# Patient Record
Sex: Female | Born: 1970 | Hispanic: Yes | Marital: Single | Smoking: Never smoker
Health system: Southern US, Community
[De-identification: ages and names within clinical notes are randomized; demographics above are authoritative.]

## PROBLEM LIST (undated history)

## (undated) DIAGNOSIS — I10 Essential (primary) hypertension: Secondary | ICD-10-CM

## (undated) DIAGNOSIS — J45909 Unspecified asthma, uncomplicated: Secondary | ICD-10-CM

---

## 2007-09-05 ENCOUNTER — Emergency Department: Admit: 2007-09-05 | Payer: Self-pay | Source: Emergency Department | Admitting: Emergency Medicine

## 2007-09-05 ENCOUNTER — Inpatient Hospital Stay (INDEPENDENT_AMBULATORY_CARE_PROVIDER_SITE_OTHER)
Admit: 2007-09-05 | Disposition: A | Discharge status: Home / Self Care | Payer: Self-pay | Source: Ambulatory Visit | Admitting: Obstetrics & Gynecology

## 2007-09-05 LAB — PRENATAL  WORKUP
AB Screen Gel: NEGATIVE
ABO Rh: O POS

## 2007-09-05 LAB — CBC AND DIFFERENTIAL
Basophils Absolute: 0 /mm3 (ref 0.0–0.2)
Basophils: 0 % (ref 0–2)
Eosinophils Absolute: 0.2 /mm3 (ref 0.0–0.7)
Eosinophils: 2 % (ref 0–5)
Granulocytes Absolute: 7.9 /mm3 (ref 1.8–8.1)
Hematocrit: 33.6 % — ABNORMAL LOW (ref 37.0–47.0)
Hgb: 10.2 G/DL — ABNORMAL LOW (ref 12.0–16.0)
Immature Granulocytes Absolute: 0
Immature Granulocytes: 0 %
Lymphocytes Absolute: 1.5 /mm3 (ref 0.5–4.4)
Lymphocytes: 14 % — ABNORMAL LOW (ref 15–41)
MCH: 18.7 PG — ABNORMAL LOW (ref 28.0–32.0)
MCHC: 30.4 G/DL — ABNORMAL LOW (ref 32.0–36.0)
MCV: 61.5 FL — ABNORMAL LOW (ref 80.0–100.0)
MPV: 10.3 FL (ref 9.4–12.3)
Monocytes Absolute: 0.6 /mm3 (ref 0.0–1.2)
Monocytes: 6 % (ref 0–11)
Neutrophils %: 78 % — ABNORMAL HIGH (ref 52–75)
Platelets: 249 /mm3 (ref 140–400)
RBC: 5.46 /mm3 — ABNORMAL HIGH (ref 4.20–5.40)
RDW: 16.5 % — ABNORMAL HIGH (ref 11.5–15.0)
WBC: 10.12 /mm3 (ref 3.50–10.80)

## 2007-09-05 LAB — HEPATITIS B SURFACE ANTIGEN W/ REFLEX TO CONFIRMATION: Hepatitis B Surface Antigen: NEGATIVE

## 2007-09-05 LAB — HIV RAPID: HIV Rapid: NONREACTIVE

## 2007-09-05 LAB — GLUCOSE CHALLENGE: Glucose Challenge: 80 mg/dL

## 2007-09-05 LAB — CELL MORPHOLOGY: RBC Morphology: ABNORMAL

## 2007-09-05 LAB — SICKLE CELL SCREEN: Sickle Screen Test: NEGATIVE

## 2007-09-06 LAB — RPR: RPR: NONREACTIVE

## 2007-10-03 LAB — FETAL FIBRONECTIN: Fetal Fibronectin: NEGATIVE

## 2007-10-03 LAB — URINALYSIS
Bilirubin, UA: NEGATIVE
Blood, UA: NEGATIVE
Glucose, UA: NEGATIVE
Ketones UA: NEGATIVE
Nitrite, UA: NEGATIVE
Protein, UR: NEGATIVE
Specific Gravity UA POCT: 1.011 (ref 1.001–1.035)
Urine pH: 6.5 (ref 5.0–8.0)
Urobilinogen, UA: 0.2 EU/dL (ref 0.2–1.0)

## 2007-10-04 LAB — RUBELLA ANTIBODY, IGG

## 2007-11-08 LAB — URINALYSIS
Bilirubin, UA: NEGATIVE
Glucose, UA: NEGATIVE
Ketones UA: NEGATIVE
Leukocyte Esterase, UA: NEGATIVE
Nitrite, UA: NEGATIVE
Protein, UR: NEGATIVE
Specific Gravity UA POCT: 1.028 (ref 1.001–1.035)
Urine pH: 7 (ref 5.0–8.0)
Urobilinogen, UA: 0.2 EU/dL (ref 0.2–1.0)

## 2007-11-14 ENCOUNTER — Observation Stay (HOSPITAL_BASED_OUTPATIENT_CLINIC_OR_DEPARTMENT_OTHER)
Admission: RE | Admit: 2007-11-14 | Disposition: A | Discharge status: Home / Self Care | Payer: Self-pay | Source: Ambulatory Visit

## 2007-11-15 ENCOUNTER — Observation Stay (HOSPITAL_BASED_OUTPATIENT_CLINIC_OR_DEPARTMENT_OTHER)
Admission: RE | Admit: 2007-11-15 | Disposition: A | Discharge status: Home / Self Care | Payer: Self-pay | Source: Ambulatory Visit

## 2007-11-19 ENCOUNTER — Inpatient Hospital Stay (HOSPITAL_BASED_OUTPATIENT_CLINIC_OR_DEPARTMENT_OTHER)
Admission: AD | Admit: 2007-11-19 | Disposition: A | Discharge status: Home / Self Care | Payer: Self-pay | Source: Ambulatory Visit

## 2007-11-19 LAB — CBC
Hematocrit: 33.3 % — ABNORMAL LOW (ref 37.0–47.0)
Hgb: 10.6 G/DL — ABNORMAL LOW (ref 12.0–16.0)
MCH: 19.1 PG — ABNORMAL LOW (ref 28.0–32.0)
MCHC: 31.8 G/DL — ABNORMAL LOW (ref 32.0–36.0)
MCV: 60.1 FL — ABNORMAL LOW (ref 80.0–100.0)
Platelets: 238 /mm3 (ref 140–400)
RBC: 5.54 /mm3 — ABNORMAL HIGH (ref 4.20–5.40)
RDW: 15 % (ref 11.5–15.0)
WBC: 12.43 /mm3 — ABNORMAL HIGH (ref 3.50–10.80)

## 2009-02-23 ENCOUNTER — Inpatient Hospital Stay
Admission: EM | Admit: 2009-02-23 | Disposition: A | Discharge status: Home / Self Care | Payer: Self-pay | Source: Emergency Department | Admitting: Obstetrics & Gynecology

## 2009-02-23 LAB — BLOOD BANK TEST INFORMATION

## 2009-02-26 LAB — COMPREHENSIVE METABOLIC PANEL
ALT: 26 U/L (ref 7–56)
AST (SGOT): 21 U/L (ref 5–40)
Albumin, Synovial: 3.1 g/dL — ABNORMAL LOW (ref 3.9–5.0)
Alkaline Phosphatase: 60 U/L (ref 38–126)
BUN / Creatinine Ratio: 16 (ref 8–20)
BUN: 12 mg/dL (ref 6–20)
Bilirubin, Total: 0.2 mg/dL (ref 0.2–1.3)
CO2: 22 mmol/L (ref 21.0–31.0)
Calcium: 9.1 mg/dL (ref 8.4–10.2)
Chloride: 102 mmol/L (ref 101–111)
Creatinine: 0.75 mg/dL (ref 0.52–1.04)
EGFR: >60 mL/min/{1.73_m2}
EGFR: >60 mL/min/{1.73_m2}
Glucose: 117 mg/dL — ABNORMAL HIGH (ref 70–100)
Potassium: 3.4 mmol/L — ABNORMAL LOW (ref 3.6–5.0)
Protein, Total: 5.9 g/dL — ABNORMAL LOW (ref 6.3–8.2)
Sodium: 136 mmol/L (ref 135–145)

## 2009-02-26 LAB — HEMOGLOBIN AND HEMATOCRIT, BLOOD
Hematocrit: 27.6 % (ref 27.0–49.5)
Hemoglobin: 8.7 g/dL — ABNORMAL LOW (ref 11.7–15.5)

## 2009-02-26 LAB — CROSSMATCH PRBCS, 2 UNITS
01 -   Blood Type: O POS
01 -   Status Info: TRANSFUSED
02 -   Blood Type: O POS
02 -   Status Info: TRANSFUSED

## 2009-02-26 LAB — CBC AND DIFFERENTIAL
BASOPHILS %: 0.2 % (ref 0.0–2.0)
BASOPHILS %: 0.3 % (ref 0.0–2.0)
Baso(Absolute): 0.03 10*3/uL (ref 0.00–0.20)
Baso(Absolute): 0.02 10*3/uL (ref 0.00–0.20)
Eosinophils %: 0.3 % (ref 0.0–6.0)
Eosinophils %: 1.3 % (ref 0.0–6.0)
Eosinophils Absolute: 0.05 10*3/uL — ABNORMAL LOW (ref 0.10–0.30)
Eosinophils Absolute: 0.09 10*3/uL — ABNORMAL LOW (ref 0.10–0.30)
Hematocrit: 32.2 % (ref 27.0–49.5)
Hematocrit: 27.5 % (ref 27.0–49.5)
Hemoglobin: 10.2 g/dL — ABNORMAL LOW (ref 11.7–15.5)
Hemoglobin: 8.8 g/dL — ABNORMAL LOW (ref 11.7–15.5)
Immature Granulocytes #: 0.04 10*3/uL (ref 0.00–0.05)
Immature Granulocytes #: 0.02 10*3/uL (ref 0.00–0.05)
Immature Granulocytes %: 0.3 % — ABNORMAL HIGH (ref 0.0–0.0)
Immature Granulocytes %: 0.3 % — ABNORMAL HIGH (ref 0.0–0.0)
Lymphocytes Absolute: 1.72 10*3/uL (ref 1.00–4.80)
Lymphocytes Absolute: 1.99 10*3/uL (ref 1.00–4.80)
Lymphocytes Relative: 11.9 % — ABNORMAL LOW (ref 25.0–55.0)
Lymphocytes Relative: 29.4 % (ref 25.0–55.0)
MCH: 18.6 pg — ABNORMAL LOW (ref 27.0–34.0)
MCH: 20.5 pg — ABNORMAL LOW (ref 27.0–34.0)
MCHC: 31.7 g/dL — ABNORMAL LOW (ref 32.0–36.0)
MCHC: 32 g/dL (ref 32.0–36.0)
MCV: 58.7 fL — ABNORMAL LOW (ref 80–100)
MCV: 64.1 fL — ABNORMAL LOW (ref 80–100)
Monocytes Absolute: 0.58 10*3/uL (ref 0.10–1.20)
Monocytes Absolute: 0.34 10*3/uL (ref 0.10–1.20)
Monocytes Relative %: 4 % (ref 1.0–8.0)
Monocytes Relative %: 5 % (ref 1.0–8.0)
Neutrophils Absolute: 12.07 10*3/uL — ABNORMAL HIGH (ref 1.80–7.70)
Neutrophils Absolute: 4.33 10*3/uL (ref 1.80–7.70)
Neutrophils Relative %: 83.6 % — ABNORMAL HIGH (ref 49.0–69.0)
Neutrophils Relative %: 64 % (ref 49.0–69.0)
Nucleated RBC %: 0 /100WBC (ref 0.0–0.0)
Nucleated RBC %: 0 /100WBC (ref 0.0–0.0)
Nucleted RBC #: 0 10*3/uL (ref 0.00–0.00)
Nucleted RBC #: 0 10*3/uL (ref 0.00–0.00)
Platelets: 244 10*3/uL (ref 150–400)
Platelets: 166 10*3/uL (ref 150–400)
RBC: 5.49 M/uL — ABNORMAL HIGH (ref 3.80–5.40)
RBC: 4.29 M/uL (ref 3.80–5.40)
RDW: 16 % — ABNORMAL HIGH (ref 11.0–14.0)
RDW: 22.3 % — ABNORMAL HIGH (ref 11.0–14.0)
WBC: 14.45 10*3/uL — ABNORMAL HIGH (ref 4.80–10.80)
WBC: 6.77 10*3/uL (ref 4.80–10.80)

## 2009-02-26 LAB — MORPH REVIEW
Cell Morphology:: ABNORMAL
Cell Morphology:: ABNORMAL
Platelet Evaluation: NORMAL
Platelet Evaluation: NORMAL

## 2009-02-26 LAB — ANTIBODY SCREEN: AB Screen Gel: NEGATIVE

## 2009-02-26 LAB — PT (PROTHROMBIN TIME)
PT INR: 1.1
PT: 12.4 s (ref 10.6–12.8)

## 2009-02-26 LAB — PTT (PARTIAL THROMBOPLASTIN TIME)
PTT Ratio: 0.8 (ref 0.8–1.2)
PTT: 22.6 s (ref 21.6–34.0)

## 2009-02-26 LAB — ABO/RH: ABO Rh: O POS

## 2009-02-26 LAB — PATH REVIEW OF BLOOD SMEAR

## 2009-02-26 LAB — HCG, SERUM, QUALITATIVE: Hcg Qualitative: POSITIVE

## 2009-02-26 LAB — HCG, QUANTITATIVE, PREGNANCY: hCG, Quant.: 2280.3 m[IU]/mL — ABNORMAL HIGH (ref 0.0–6.1)

## 2011-04-26 NOTE — Op Note (Signed)
Jasmine Moran, Jasmine Moran                                                    MRN:          [id number]                                                          Account:      [id number]                                                     Document ID:  [id number]                                               Procedure Date: 02/24/2009                                                                                    MRN: [id number]  Admit Date: 02/23/2009     Patient Location: DISCHARGED 02/24/2009  Patient Type: I     SURGEON: ANGELA C BESS MD  ASSISTANT:          BRIEF PROCEDURE NOTE:    The patient is a 40 year old female seen and evaluated in the emergency  room for an incomplete abortion.  The patient underwent a manual vacuum  aspiration of products of conception.     BRIEF DESCRIPTION OF PROCEDURE:  After the patient was assessed and consent was obtained, a speculum was  placed.  The cervix was visibly dilated and there was noted to be clot and  debris at the cervical os.  A manual vacuum aspirator curette was then  placed through the cervix and advanced into the uterine cavity.  While  applying suction to the manual aspirator, there was a large return of  products of conception.  Several passes of the manual vacuum aspirator were  performed until the uterine contents were felt to be emptied.  The  patient's bleeding decreased significantly.     ESTIMATED BLOOD LOSS:  Approximately 500 mL during this procedure.  The patient had no anesthesia.   The patient tolerated the procedure well and there were no complications.              D:  04/09/2009 14:23 PM by Esmeralda Arthur, MD ([id number])  T:  04/12/2009 08:43 AM by Gwenyth Bender        cc:  Page 1 of 1  Authenticated by Esmeralda Arthur, MD On 04/13/2009 09:05:50 PM

## 2017-09-19 ENCOUNTER — Ambulatory Visit
Admission: RE | Admit: 2017-09-19 | Discharge: 2017-09-19 | Disposition: A | Discharge status: Home / Self Care | Payer: PRIVATE HEALTH INSURANCE | Source: Ambulatory Visit | Attending: Critical Care Medicine | Admitting: Critical Care Medicine

## 2017-09-19 DIAGNOSIS — D509 Iron deficiency anemia, unspecified: Secondary | ICD-10-CM

## 2017-09-19 DIAGNOSIS — J45909 Unspecified asthma, uncomplicated: Secondary | ICD-10-CM | POA: Insufficient documentation

## 2017-09-19 LAB — CBC AND DIFFERENTIAL
Absolute NRBC: 0 10*3/uL
Basophils Absolute Automated: 0.05 10*3/uL (ref 0.00–0.20)
Basophils Automated: 0.7 %
Eosinophils Absolute Automated: 0.23 10*3/uL (ref 0.00–0.70)
Eosinophils Automated: 3.1 %
Hematocrit: 43 % (ref 37.0–47.0)
Hgb: 12.7 g/dL (ref 12.0–16.0)
Immature Granulocytes Absolute: 0.03 10*3/uL
Immature Granulocytes: 0.4 %
Lymphocytes Absolute Automated: 1.97 10*3/uL (ref 0.50–4.40)
Lymphocytes Automated: 26.5 %
MCH: 18.3 pg — ABNORMAL LOW (ref 28.0–32.0)
MCHC: 29.5 g/dL — ABNORMAL LOW (ref 32.0–36.0)
MCV: 62 fL — ABNORMAL LOW (ref 80.0–100.0)
Monocytes Absolute Automated: 0.43 10*3/uL (ref 0.00–1.20)
Monocytes: 5.8 %
Neutrophils Absolute: 4.71 10*3/uL (ref 1.80–8.10)
Neutrophils: 63.5 %
Nucleated RBC: 0 /100 WBC (ref 0.0–1.0)
Platelets: 228 10*3/uL (ref 140–400)
RBC: 6.93 10*6/uL — ABNORMAL HIGH (ref 4.20–5.40)
RDW: 19 % — ABNORMAL HIGH (ref 12–15)
WBC: 7.42 10*3/uL (ref 3.50–10.80)

## 2017-09-19 LAB — CELL MORPHOLOGY
Cell Morphology: ABNORMAL — AB
Platelet Estimate: NORMAL

## 2017-09-21 LAB — CBC PATHOLOGIST REVIEW

## 2017-09-23 LAB — IGE: Immunoglobulin E: 94 (ref <=114)

## 2017-09-24 LAB — RESPIRATORY ALLERGY PROFILE, REGION II

## 2021-08-17 ENCOUNTER — Emergency Department: Payer: Self-pay

## 2021-08-17 ENCOUNTER — Inpatient Hospital Stay
Admission: EM | Admit: 2021-08-17 | Discharge: 2021-08-18 | DRG: 193 | Disposition: A | Discharge status: Home / Self Care | Payer: Self-pay | Attending: Student in an Organized Health Care Education/Training Program | Admitting: Student in an Organized Health Care Education/Training Program

## 2021-08-17 DIAGNOSIS — I10 Essential (primary) hypertension: Secondary | ICD-10-CM | POA: Diagnosis present

## 2021-08-17 DIAGNOSIS — J9601 Acute respiratory failure with hypoxia: Secondary | ICD-10-CM | POA: Diagnosis present

## 2021-08-17 DIAGNOSIS — R7989 Other specified abnormal findings of blood chemistry: Secondary | ICD-10-CM | POA: Diagnosis present

## 2021-08-17 DIAGNOSIS — Z20822 Contact with and (suspected) exposure to covid-19: Secondary | ICD-10-CM | POA: Diagnosis present

## 2021-08-17 DIAGNOSIS — J189 Pneumonia, unspecified organism: Principal | ICD-10-CM | POA: Diagnosis present

## 2021-08-17 DIAGNOSIS — J168 Pneumonia due to other specified infectious organisms: Secondary | ICD-10-CM

## 2021-08-17 DIAGNOSIS — R55 Syncope and collapse: Secondary | ICD-10-CM

## 2021-08-17 DIAGNOSIS — Z79899 Other long term (current) drug therapy: Secondary | ICD-10-CM

## 2021-08-17 DIAGNOSIS — R3915 Urgency of urination: Secondary | ICD-10-CM

## 2021-08-17 DIAGNOSIS — J45901 Unspecified asthma with (acute) exacerbation: Secondary | ICD-10-CM | POA: Diagnosis present

## 2021-08-17 DIAGNOSIS — R4182 Altered mental status, unspecified: Secondary | ICD-10-CM

## 2021-08-17 HISTORY — DX: Essential (primary) hypertension: I10

## 2021-08-17 HISTORY — DX: Unspecified asthma, uncomplicated: J45.909

## 2021-08-17 LAB — VH XPERT XPRESS © COV-2/FLU/RSV PLUS
Does patient reside in a congregate care setting?: NEGATIVE
Influenza A RNA: NEGATIVE
Influenza B RNA: NEGATIVE
Is patient employed in a healthcare setting?: NEGATIVE
RSV RNA: NEGATIVE
SARS-CoV-2 RNA: NEGATIVE

## 2021-08-17 LAB — I-STAT LACTIC ACID
Lactic Acid I-Stat: 4.9 mMol/L (ref 0.5–1.9)
Lactic Acid I-Stat: 6.1 mMol/L (ref 0.5–1.9)
Room Number I-Stat: 5
Room Number I-Stat: 5

## 2021-08-17 LAB — VH STREP PNEUMONIAE URINE ANTIGEN: Urine, Strep pneumoniae Antigen: NOT DETECTED

## 2021-08-17 LAB — COMPREHENSIVE METABOLIC PANEL
ALT: 42 U/L (ref 0–55)
AST (SGOT): 25 U/L (ref 10–42)
Albumin/Globulin Ratio: 1.12 Ratio (ref 0.80–2.00)
Albumin: 3.8 gm/dL (ref 3.5–5.0)
Alkaline Phosphatase: 92 U/L (ref 40–145)
Anion Gap: 14.4 mMol/L (ref 7.0–18.0)
BUN / Creatinine Ratio: 13.4 Ratio (ref 10.0–30.0)
BUN: 11 mg/dL (ref 7–22)
Bilirubin, Total: 0.6 mg/dL (ref 0.1–1.2)
CO2: 23 mMol/L (ref 20–30)
Calcium: 9.2 mg/dL (ref 8.5–10.5)
Chloride: 108 mMol/L (ref 98–110)
Creatinine: 0.82 mg/dL (ref 0.60–1.20)
EGFR: 87 mL/min/{1.73_m2} (ref 60–150)
Globulin: 3.4 gm/dL (ref 2.0–4.0)
Glucose: 118 mg/dL — ABNORMAL HIGH (ref 71–99)
Osmolality Calculated: 284 mOsm/kg (ref 275–300)
Potassium: 3.4 mMol/L — ABNORMAL LOW (ref 3.5–5.3)
Protein, Total: 7.2 gm/dL (ref 6.0–8.3)
Sodium: 142 mMol/L (ref 136–147)

## 2021-08-17 LAB — CBC AND DIFFERENTIAL
Basophils %: 1.2 % (ref 0.0–3.0)
Basophils Absolute: 0.1 10*3/uL (ref 0.0–0.3)
Eosinophils %: 3.4 % (ref 0.0–7.0)
Eosinophils Absolute: 0.3 10*3/uL (ref 0.0–0.8)
Hematocrit: 42.4 % (ref 36.0–48.0)
Hemoglobin: 13.2 gm/dL (ref 12.0–16.0)
Lymphocytes Absolute: 3.4 10*3/uL (ref 0.6–5.1)
Lymphocytes: 44 % (ref 15.0–46.0)
MCH: 19 pg — ABNORMAL LOW (ref 28–35)
MCHC: 31 gm/dL — ABNORMAL LOW (ref 32–36)
MCV: 61 fL — ABNORMAL LOW (ref 80–100)
MPV: 5.9 fL — ABNORMAL LOW (ref 6.0–10.0)
Monocytes Absolute: 0.6 10*3/uL (ref 0.1–1.7)
Monocytes: 8.1 % (ref 3.0–15.0)
Neutrophils %: 43.3 % (ref 42.0–78.0)
Neutrophils Absolute: 3.4 10*3/uL (ref 1.7–8.6)
PLT CT: 192 10*3/uL (ref 130–440)
RBC: 6.93 10*6/uL — ABNORMAL HIGH (ref 3.80–5.00)
RDW: 14.4 % — ABNORMAL HIGH (ref 11.0–14.0)
WBC: 7.8 10*3/uL (ref 4.0–11.0)

## 2021-08-17 LAB — B-TYPE NATRIURETIC PEPTIDE: B-Natriuretic Peptide: 17.9 pg/mL (ref 0.0–100.0)

## 2021-08-17 LAB — VH URINALYSIS WITH MICROSCOPIC AND CULTURE IF INDICATED
Bilirubin, UA: NEGATIVE mg/dL
Glucose, UA: NEGATIVE mg/dL
Ketones UA: NEGATIVE
Leukocyte Esterase, UA: NEGATIVE Leu/uL
Nitrite, UA: NEGATIVE
Protein, UR: 30 mg/dL — AB
RBC, UA: 2 /hpf (ref 0–5)
Squam Epithel, UA: <1 /hpf (ref 0–2)
Urine Specific Gravity: 1.01 (ref 1.005–1.030)
Urobilinogen, UA: 0.2 mg/dL
WBC, UA: 1 /hpf (ref 0–4)
pH, Urine: 6.5 pH (ref 5.0–8.0)

## 2021-08-17 LAB — VH LEGIONELLA ANTIGEN URINE: Urine Legionella Antigen Rapid: NOT DETECTED

## 2021-08-17 LAB — VH CULTURE AND SMEAR, RESPIRATORY

## 2021-08-17 LAB — TROPONIN I: Troponin I: <0.01 ng/mL (ref 0.00–0.02)

## 2021-08-17 LAB — MAGNESIUM: Magnesium: 1.8 mg/dL (ref 1.6–2.6)

## 2021-08-17 LAB — VH I-STAT LACTIC ACID NOTIFICATION

## 2021-08-17 LAB — LACTIC ACID, PLASMA
Lactic Acid: 8.5 mMol/L (ref 0.5–1.9)
Lactic Acid: 6.6 mMol/L (ref 0.5–1.9)
Lactic Acid: 5 mMol/L (ref 0.5–1.9)
Lactic Acid: 1.5 mMol/L (ref 0.5–1.9)

## 2021-08-17 LAB — D-DIMER, QUANTITATIVE: D-Dimer: 0.25 mg/L FEU (ref 0.19–0.52)

## 2021-08-17 LAB — PROCALCITONIN: Procalcitonin: 0.03 ng/mL (ref 0.00–0.24)

## 2021-08-17 MED ORDER — METHYLPREDNISOLONE SODIUM SUCC 40 MG IJ SOLR
INTRAMUSCULAR | Status: AC
Start: 2021-08-17 — End: ?
  Filled 2021-08-17: qty 1

## 2021-08-17 MED ORDER — ALBUTEROL-IPRATROPIUM 2.5-0.5 (3) MG/3ML IN SOLN
3.0000 mL | Freq: Once | RESPIRATORY_TRACT | Status: AC
Start: 2021-08-17 — End: 2021-08-17
  Administered 2021-08-17: 01:00:00 3 mL via RESPIRATORY_TRACT

## 2021-08-17 MED ORDER — VH MAGNESIUM SULFATE 2 G IN 50 ML IV PREMIX
INTRAVENOUS | Status: AC
Start: 2021-08-17 — End: ?
  Filled 2021-08-17: qty 50

## 2021-08-17 MED ORDER — ALBUTEROL-IPRATROPIUM 2.5-0.5 (3) MG/3ML IN SOLN
RESPIRATORY_TRACT | Status: AC
Start: 2021-08-17 — End: ?
  Filled 2021-08-17: qty 3

## 2021-08-17 MED ORDER — SODIUM CHLORIDE (PF) 0.9 % IJ SOLN
1.0000 g | Freq: Once | INTRAMUSCULAR | Status: AC
Start: 2021-08-17 — End: 2021-08-17
  Administered 2021-08-17: 03:00:00 1 g via INTRAVENOUS

## 2021-08-17 MED ORDER — AMLODIPINE BESYLATE 5 MG PO TABS
ORAL_TABLET | ORAL | Status: AC
Start: 2021-08-17 — End: ?
  Filled 2021-08-17: qty 1

## 2021-08-17 MED ORDER — VH LACTATED RINGERS IV BOLUS
1000.0000 mL | Freq: Once | INTRAVENOUS | Status: AC
Start: 2021-08-17 — End: 2021-08-17
  Administered 2021-08-17: 17:00:00 1000 mL via INTRAVENOUS

## 2021-08-17 MED ORDER — SODIUM CHLORIDE (PF) 0.9 % IJ SOLN
3.0000 mL | Freq: Two times a day (BID) | INTRAMUSCULAR | Status: DC
Start: 2021-08-17 — End: 2021-08-18
  Administered 2021-08-17 – 2021-08-18 (×3): 3 mL via INTRAVENOUS

## 2021-08-17 MED ORDER — VH SODIUM CHLORIDE 0.9 % IV BOLUS
1000.0000 mL | Freq: Once | INTRAVENOUS | Status: AC
Start: 2021-08-17 — End: 2021-08-17
  Administered 2021-08-17: 05:00:00 1000 mL via INTRAVENOUS

## 2021-08-17 MED ORDER — SODIUM CHLORIDE (PF) 0.9 % IJ SOLN
0.4000 mg | INTRAMUSCULAR | Status: DC | PRN
Start: 2021-08-17 — End: 2021-08-18

## 2021-08-17 MED ORDER — AMLODIPINE BESYLATE 5 MG PO TABS
5.0000 mg | ORAL_TABLET | Freq: Every day | ORAL | Status: DC
Start: 2021-08-17 — End: 2021-08-18
  Administered 2021-08-17 – 2021-08-18 (×2): 5 mg via ORAL
  Filled 2021-08-17: qty 1

## 2021-08-17 MED ORDER — ONDANSETRON HCL 4 MG/2ML IJ SOLN
4.0000 mg | Freq: Three times a day (TID) | INTRAMUSCULAR | Status: DC | PRN
Start: 2021-08-17 — End: 2021-08-18

## 2021-08-17 MED ORDER — ONDANSETRON 4 MG PO TBDP
4.0000 mg | ORAL_TABLET | Freq: Three times a day (TID) | ORAL | Status: DC | PRN
Start: 2021-08-17 — End: 2021-08-18

## 2021-08-17 MED ORDER — METHYLPREDNISOLONE SODIUM SUCC 40 MG IJ SOLR
40.0000 mg | Freq: Two times a day (BID) | INTRAMUSCULAR | Status: DC
Start: 2021-08-17 — End: 2021-08-18
  Administered 2021-08-17 – 2021-08-18 (×3): 40 mg via INTRAVENOUS
  Filled 2021-08-17 (×2): qty 1

## 2021-08-17 MED ORDER — AZITHROMYCIN 250 MG PO TABS
500.0000 mg | ORAL_TABLET | ORAL | Status: DC
Start: 2021-08-18 — End: 2021-08-18
  Administered 2021-08-18: 06:00:00 500 mg via ORAL
  Filled 2021-08-17: qty 2

## 2021-08-17 MED ORDER — ALBUTEROL-IPRATROPIUM 2.5-0.5 (3) MG/3ML IN SOLN
3.0000 mL | Freq: Once | RESPIRATORY_TRACT | Status: AC
Start: 2021-08-17 — End: 2021-08-17
  Administered 2021-08-17: 02:00:00 3 mL via RESPIRATORY_TRACT

## 2021-08-17 MED ORDER — ALBUTEROL-IPRATROPIUM 2.5-0.5 (3) MG/3ML IN SOLN
3.0000 mL | Freq: Four times a day (QID) | RESPIRATORY_TRACT | Status: DC
Start: 2021-08-17 — End: 2021-08-17
  Administered 2021-08-17 (×2): 3 mL via RESPIRATORY_TRACT

## 2021-08-17 MED ORDER — ENOXAPARIN SODIUM 40 MG/0.4ML IJ SOSY
PREFILLED_SYRINGE | INTRAMUSCULAR | Status: AC
Start: 2021-08-17 — End: ?
  Filled 2021-08-17: qty 0.4

## 2021-08-17 MED ORDER — METHYLPREDNISOLONE SODIUM SUCC 125 MG IJ SOLR
INTRAMUSCULAR | Status: AC
Start: 2021-08-17 — End: ?
  Filled 2021-08-17: qty 2

## 2021-08-17 MED ORDER — ENOXAPARIN SODIUM 40 MG/0.4ML IJ SOSY
40.0000 mg | PREFILLED_SYRINGE | INTRAMUSCULAR | Status: DC
Start: 2021-08-17 — End: 2021-08-18
  Administered 2021-08-17 – 2021-08-18 (×2): 40 mg via SUBCUTANEOUS
  Filled 2021-08-17: qty 0.4

## 2021-08-17 MED ORDER — ALBUTEROL-IPRATROPIUM 2.5-0.5 (3) MG/3ML IN SOLN
3.0000 mL | RESPIRATORY_TRACT | Status: DC
Start: 2021-08-17 — End: 2021-08-18
  Administered 2021-08-17 – 2021-08-18 (×7): 3 mL via RESPIRATORY_TRACT
  Filled 2021-08-17 (×5): qty 3

## 2021-08-17 MED ORDER — CEFTRIAXONE SODIUM 1 G IJ SOLR
INTRAMUSCULAR | Status: AC
Start: 2021-08-17 — End: ?
  Filled 2021-08-17: qty 1000

## 2021-08-17 MED ORDER — VH LACTATED RINGERS IV BOLUS
500.0000 mL | Freq: Once | INTRAVENOUS | Status: AC
Start: 2021-08-17 — End: 2021-08-17
  Administered 2021-08-17: 11:00:00 500 mL via INTRAVENOUS

## 2021-08-17 MED ORDER — VH MAGNESIUM SULFATE 2 G IN 50 ML IV PREMIX
2.0000 g | Freq: Once | INTRAVENOUS | Status: AC
Start: 2021-08-17 — End: 2021-08-17
  Administered 2021-08-17: 01:00:00 2 g via INTRAVENOUS

## 2021-08-17 MED ORDER — SODIUM CHLORIDE 0.9 % IV SOLN
500.0000 mg | Freq: Once | INTRAVENOUS | Status: AC
Start: 2021-08-17 — End: 2021-08-17
  Administered 2021-08-17: 03:00:00 500 mg via INTRAVENOUS
  Filled 2021-08-17: qty 500

## 2021-08-17 MED ORDER — SODIUM CHLORIDE (PF) 0.9 % IJ SOLN
1.0000 g | INTRAMUSCULAR | Status: DC
Start: 2021-08-17 — End: 2021-08-18
  Administered 2021-08-17: 23:00:00 1 g via INTRAVENOUS
  Filled 2021-08-17: qty 1000

## 2021-08-17 MED ORDER — ALBUTEROL SULFATE (2.5 MG/3ML) 0.083% IN NEBU
INHALATION_SOLUTION | RESPIRATORY_TRACT | Status: AC
Start: 2021-08-17 — End: ?
  Filled 2021-08-17: qty 3

## 2021-08-17 MED ORDER — LACTATED RINGERS IV SOLN
INTRAVENOUS | Status: DC
Start: 2021-08-17 — End: 2021-08-18

## 2021-08-17 MED ORDER — ALBUTEROL-IPRATROPIUM 2.5-0.5 (3) MG/3ML IN SOLN
RESPIRATORY_TRACT | Status: AC
Start: 2021-08-17 — End: ?
  Filled 2021-08-17: qty 6

## 2021-08-17 MED ORDER — IPRATROPIUM BROMIDE 0.02 % IN SOLN
RESPIRATORY_TRACT | Status: AC
Start: 2021-08-17 — End: ?
  Filled 2021-08-17: qty 2.5

## 2021-08-17 MED ORDER — VH SODIUM CHLORIDE 0.9 % IV BOLUS
1000.0000 mL | Freq: Once | INTRAVENOUS | Status: AC
Start: 2021-08-17 — End: 2021-08-17
  Administered 2021-08-17: 07:00:00 1000 mL via INTRAVENOUS

## 2021-08-17 NOTE — Plan of Care (Addendum)
NURSE NOTE SUMMARY  [facility] - GI/ENDO/GEN MED   Patient Name: Jasmine Moran   Attending Physician: Rickard Rhymes, MD   Today's date:   08/17/2021 LOS: 0 days   Shift Summary:                                                              1340 - Pt arrived from ED. Pt assessment complete. Pt son at bedside. Pt & pt son oriented to unit. Fall precautions in place. Pt declines needs a this time. Call bell w/in reach.    1631 - Initiated LR bolus. Pt declines any other needs at this time. Fall precautions in place & son at bedside.     1738 - LR bolus complete, LR bag replaced & running at 100 mL/hr. Pt husband & son at bedside. Pt & family decline needs.     1827 - Lab called w/ critical, pt lactic 5.0. MD aware.   Provider Notifications:        Rapid Response Notifications:  Mobility:            Weight tracking:  Family Dynamic:   Last 3 Weights for the past 72 hrs (Last 3 readings):   Weight   08/17/21 0059 106.6 kg (235 lb 0.2 oz)             Last Bowel Movement   No data recorded        Problem: Safety  Goal: Patient will be free from injury during hospitalization  Outcome: Progressing  Flowsheets (Taken 08/17/2021 1544)  Patient will be free from injury during hospitalization:   Assess patient's risk for falls and implement fall prevention plan of care per policy   Provide and maintain safe environment   Use appropriate transfer methods   Ensure appropriate safety devices are available at the bedside  Goal: Patient will be free from infection during hospitalization  Outcome: Progressing  Flowsheets (Taken 08/17/2021 1544)  Free from Infection during hospitalization:   Assess and monitor for signs and symptoms of infection   Monitor lab/diagnostic results   Monitor all insertion sites (i.e. indwelling lines, tubes, urinary catheters, and drains)     Problem: Compromised Hemodynamic Status  Goal: Vital signs and fluid balance maintained/improved  Outcome: Progressing  Flowsheets (Taken  08/17/2021 1544)  Vital signs and fluid balance are maintained/improved:   Position patient for maximum circulation/cardiac output   Monitor/assess vitals and hemodynamic parameters with position changes   Monitor and compare daily weight   Monitor/assess lab values and report abnormal values

## 2021-08-17 NOTE — H&P (Signed)
Medicine History & Physical   [facility]  [employer]   Patient Name: Jasmine Moran, Jasmine Moran LOS: 0 days   Attending Physician: Dierdre Searles, DO;Alyacou* PCP: Berenda Morale, MD      Assessment and Plan:                                                                #Acute hypoxic respiratory failure  #Acute asthma exacerbation  #Community-acquired pneumonia  -Tachycardic, tachypneic, hypoxic on presentation in 70s with EMS, afebrile  -No leukocytosis  -CXR per my review Hypoinflation with mild right basilar atelectasis.  Possible left basilar pneumonia  -Status post DuoNebs, Solu-Medrol in the ED  -DuoNebs around-the-clock  -Solu-Medrol 40 twice daily  -sputum culture, new urine pneumococcal, urine Legionella antigen  -Azithromycin and ceftriaxone for community-acquired pneumonia  -Ordered ABGs  -Monitor peak flow meter  -Oxygen supplementation with goal O2 saturation more than 90-92%    #Hypertension  Continue amlodipine    DVT PPx: Medication VTE Prophylaxis Orders: enoxaparin (LOVENOX) syringe 40 mg  Mechanical VTE Prophylaxis Orders: Reason for no mechanical VTE prophylaxis - Treatment not indicated  Dispo: Inpatient  Healthcare Proxy: Ulloa,Carlos   Code: full code     History of Presenting Illness                                CC: Shortness of breath  Jasmine Moran is a 51 y.o. female patient past medical history of hypertension asthma who presented to the ED with complaints of shortness of breath that worsened yesterday.  States that she has been having worsening productive cough of greenish sputum last week positive chills, nausea, vomiting.  She has been using her albuterol inhaler more often.  Denies any fever, chest pain, diarrhea, urinary symptoms.  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  She also reported that she was diagnosed with hypertension 2 weeks ago and was started on amlodipine.  Stated she has been diagnosed with  asthma 3 years ago and has not had significant asthma attack until today.  He denies any smoking  Past Medical History:   Diagnosis Date    Asthma     Hypertension      History reviewed. No pertinent surgical history.    History reviewed. No pertinent family history.  Social History     Tobacco Use    Smoking status: Never   Vaping Use    Vaping Use: Never used   Substance Use Topics    Alcohol use: Never    Drug use: Never            Subjective     Review of Systems:  Review of systems as noted in HPI. Full 12 point ROS otherwise negative.         Objective   Physical Exam:     Vitals: T:97.7 F (36.5 C) (Axillary),  BP:124/73, HR:(!) 106, RR:18, SaO2:96%    1) General Appearance: Alert and oriented x 4. In no acute distress.   2) Eyes: Pink conjunctiva, anicteric sclera. Pupils are equally reactive to light.  3) ENT: Oral mucosa moist with no pharyngeal congestion, erythema  or swelling.  4) Neck: Supple, with full range of motion. Trachea is central, no JVD noted  5) Chest: Rhonchi Wheezing bilaterally  6) CVS: normal rate and regular rhythm, with no murmurs.  7) Abdomen: Soft, non-tender, no palpable mass. Bowel sounds normal. No CVA tenderness  8) Extremities: No pitting edema, pulses palpable, no calf swelling and no gross deformity.  9) Skin: Warm, dry with normal skin turgor, no rash  10) Lymphatics: No lymphadenopathy in axillary, cervical and inguinal area.   11) Neurological: Cranial nerves II-XII intact. No gross focal motor or sensory deficits noted.  12) Psychiatric: Affect is appropriate. No hallucinations.      Chest Xray: Per my review shows left basilar pneumonia    Patient Vitals for the past 12 hrs:   BP Temp Pulse Resp   08/17/21 0400 124/73 -- (!) 106 18   08/17/21 0313 123/71 -- (!) 109 18   08/17/21 0131 129/78 -- (!) 111 14   08/17/21 0114 132/81 -- (!) 110 17   08/17/21 0059 136/79 97.7 F (36.5 C) (!) 102 16     Weight Monitoring 08/17/2021   Weight 106.6 kg   Weight Method Bed Scale            LABS:  CrCl cannot be calculated (Unknown ideal weight.).   Recent Results (from the past 24 hour(s))   CBC and differential    Collection Time: 08/17/21 12:58 AM   Result Value Ref Range    WBC 7.8 4.0 - 11.0 K/cmm    RBC 6.93 (H) 3.80 - 5.00 M/cmm    Hemoglobin 13.2 12.0 - 16.0 gm/dL    Hematocrit 54.0 98.1 - 48.0 %    MCV 61 (L) 80 - 100 fL    MCH 19 (L) 28 - 35 pg    MCHC 31 (L) 32 - 36 gm/dL    RDW 19.1 (H) 47.8 - 14.0 %    PLT CT 192 130 - 440 K/cmm    MPV 5.9 (L) 6.0 - 10.0 fL    Neutrophils % 43.3 42.0 - 78.0 %    Lymphocytes 44.0 15.0 - 46.0 %    Monocytes 8.1 3.0 - 15.0 %    Eosinophils % 3.4 0.0 - 7.0 %    Basophils % 1.2 0.0 - 3.0 %    Neutrophils Absolute 3.4 1.7 - 8.6 K/cmm    Lymphocytes Absolute 3.4 0.6 - 5.1 K/cmm    Monocytes Absolute 0.6 0.1 - 1.7 K/cmm    Eosinophils Absolute 0.3 0.0 - 0.8 K/cmm    Basophils Absolute 0.1 0.0 - 0.3 K/cmm   Comprehensive metabolic panel    Collection Time: 08/17/21 12:58 AM   Result Value Ref Range    Sodium 142 136 - 147 mMol/L    Potassium 3.4 (L) 3.5 - 5.3 mMol/L    Chloride 108 98 - 110 mMol/L    CO2 23 20 - 30 mMol/L    Calcium 9.2 8.5 - 10.5 mg/dL    Glucose 295 (H) 71 - 99 mg/dL    Creatinine 6.21 3.08 - 1.20 mg/dL    BUN 11 7 - 22 mg/dL    Protein, Total 7.2 6.0 - 8.3 gm/dL    Albumin 3.8 3.5 - 5.0 gm/dL    Alkaline Phosphatase 92 40 - 145 U/L    ALT 42 0 - 55 U/L    AST (SGOT) 25 10 - 42 U/L    Bilirubin, Total 0.6 0.1 - 1.2 mg/dL  Albumin/Globulin Ratio 1.12 0.80 - 2.00 Ratio    Anion Gap 14.4 7.0 - 18.0 mMol/L    BUN / Creatinine Ratio 13.4 10.0 - 30.0 Ratio    EGFR 87 60 - 150 mL/min/1.31m2    Osmolality Calculated 284 275 - 300 mOsm/kg    Globulin 3.4 2.0 - 4.0 gm/dL   Troponin I    Collection Time: 08/17/21 12:58 AM   Result Value Ref Range    Troponin I <0.01 0.00 - 0.02 ng/mL   B-type Natriuretic Peptide    Collection Time: 08/17/21 12:58 AM   Result Value Ref Range    B-Natriuretic Peptide 17.9 0.0 - 100.0 pg/mL   Magnesium     Collection Time: 08/17/21 12:58 AM   Result Value Ref Range    Magnesium 1.8 1.6 - 2.6 mg/dL   D-dimer, quantitative    Collection Time: 08/17/21 12:58 AM   Result Value Ref Range    D-Dimer 0.25 0.19 - 0.52 mg/L FEU   Respiratory Specimen Xpert Xpress  CoV-2 / Flu / RSV Plus    Collection Time: 08/17/21  1:06 AM    Specimen: Nasopharyngeal Swab   Result Value Ref Range    Influenza A RNA Negative Negative    Influenza B RNA Negative Negative    RSV RNA Negative Negative    SARS-CoV-2 RNA Negative Negative    Does patient have symptoms related to condition of interest? Y     Is patient employed in a healthcare setting? N     Does patient reside in a congregate care setting? N     Is the patient pregnant? UNK    Urinalysis w Microscopic and Culture if Indicated    Collection Time: 08/17/21  2:30 AM    Specimen: Urine, Random   Result Value Ref Range    Color, UA Yellow Colorless,Light-Yellow,Yellow    Clarity, UA Clear Clear    Urine Specific Gravity 1.010 1.005 - 1.030    pH, Urine 6.5 5.0 - 8.0 pH    Protein, UR 30 (A) Negative mg/dL    Glucose, UA Negative Negative mg/dL    Ketones UA Negative Negative    Bilirubin, UA Negative Negative mg/dL    Blood, UA Trace (A) Negative mg/dL    Nitrite, UA Negative Negative    Urobilinogen, UA 0.2 0.2 mg/dL    Leukocyte Esterase, UA Negative Negative Leu/uL    UR Micro Performed     WBC, UA 1 0 - 4 /hpf    RBC, UA 2 0 - 5 /hpf    Squam Epithel, UA <1 0 - 2 /hpf   Lactic Acid I-Stat    Collection Time: 08/17/21  4:35 AM   Result Value Ref Range    I-STAT Notification Istat Notification    i-Stat Lactic AcID    Collection Time: 08/17/21  4:41 AM   Result Value Ref Range    Room Number I-Stat 5     Sample I-Stat Venous     Site I-Stat R Radial     Lactic Acid I-Stat 4.9 (HH) 0.5 - 1.9 mMol/L          No Known Allergies   XR Chest AP Portable    Result Date: 08/17/2021  1. Hypoinflation with mild right basilar atelectasis. There appear to be airspace opacifications at the left  base and cannot exclude infiltrate. ReadingStation:MAY[facility]      Home Medications  albuterol sulfate HFA (PROVENTIL) 108 (90 Base) MCG/ACT inhaler     Inhale 2 puffs into the lungs every 8 (eight) hours as needed for Wheezing     amLODIPine (NORVASC) 5 MG tablet     Take 5 mg by mouth daily Pt is taking amlodipine-besylate           Meds given in the ED:  Medications   sodium chloride (PF) 0.9 % injection 3 mL (has no administration in time range)   naloxone (NARCAN) injection 0.4 mg (has no administration in time range)   enoxaparin (LOVENOX) syringe 40 mg (has no administration in time range)   ondansetron (ZOFRAN-ODT) disintegrating tablet 4 mg (has no administration in time range)     Or   ondansetron (ZOFRAN) injection 4 mg (has no administration in time range)   albuterol-ipratropium (DUO-NEB) 2.5-0.5(3) mg/3 mL nebulizer 3 mL (has no administration in time range)   methylPREDNISolone sodium succinate (Solu-MEDROL) injection 40 mg (has no administration in time range)   cefTRIAXone (ROCEPHIN) 1 g in sodium chloride (PF) 0.9 % 10 mL Injection (has no administration in time range)   azithromycin (ZITHROMAX) tablet 500 mg (has no administration in time range)   albuterol-ipratropium (DUO-NEB) 2.5-0.5(3) mg/3 mL nebulizer 3 mL (3 mLs Nebulization Given 08/17/21 0058)   albuterol-ipratropium (DUO-NEB) 2.5-0.5(3) mg/3 mL nebulizer 3 mL (3 mLs Nebulization Given 08/17/21 0150)   magnesium sulfate 2 g in 50 mL IVPB (Premix) 2 g (0 g Intravenous Stopped 08/17/21 0227)   albuterol-ipratropium (DUO-NEB) 2.5-0.5(3) mg/3 mL nebulizer 3 mL (3 mLs Nebulization Given 08/17/21 0227)   cefTRIAXone (ROCEPHIN) 1 g in sodium chloride (PF) 0.9 % 10 mL Injection (1 g Intravenous Given 08/17/21 0304)   azithromycin (ZITHROMAX) 500 mg in sodium chloride 0.9 % 250 mL IVPB (vial-mate) (0 mg Intravenous Stopped 08/17/21 0408)      Time Spent:     Rickard Rhymes, MD     08/17/21,4:45 AM   MRN: [id number]                                       CSN: [id number] DOB: Nov 01, 1970

## 2021-08-17 NOTE — ED Triage Notes (Signed)
Pt states that she have a hx of asthma & been takin albuterol puff at home. She have a on & off cough for months now with greenish phlegm. She states that she have a worsening cough & SOB las night then passed out before the EMS came. She said she had taken the puff but it didn't help her.

## 2021-08-17 NOTE — Progress Notes (Signed)
1010-Lab notified this RN patients lactic acid 8.5 at this time, provider notified. New orders received for LR 100/cc/hr. Rapid Response notified at this time. ER charge made aware.

## 2021-08-17 NOTE — Plan of Care (Addendum)
NURSE NOTE SUMMARY  [facility] - GI/ENDO/GEN MED   Patient Name: Jasmine Moran   Attending Physician: Sherlon Handing, MD   Today's date:   08/17/2021 LOS: 0 days   Shift Summary:                                                              Assumed care of the pt at shift change. Pt's son left for the night. Pt's husband is staying through the shift. Iv fluids infusing. No complaints of pain, nausea, or shortness of breath. Call bell within reach. Will continue to monitor the pt.     08/17/21 2255: scheduled medications administered. Call bel within reach. Fresh water at bedside.     08/18/21 0143: new bag iv fluids infusing. Pt resting quietly in bed. Husband asleep on the couch. Will continue to monitor the pt.    08/18/21 0300: pt resting quietly in bed. Husband asleep on the couch. Iv fluids infusing.     08/18/21 0618: iv fluids infusing. Fresh water at bedside. No complaints of pain or nausea. Scheduled medications administered per mar. Report to be given to oncoming nurse. Call bell within reach.     Provider Notifications:        Rapid Response Notifications:  Mobility:      PMP Activity: Step 6 - Walks in Room (08/17/2021  8:41 PM)     Weight tracking:  Family Dynamic:   Last 3 Weights for the past 72 hrs (Last 3 readings):   Weight   08/17/21 1554 105.8 kg (233 lb 3.2 oz)   08/17/21 1500 106.6 kg (235 lb 0.2 oz)   08/17/21 0059 106.6 kg (235 lb 0.2 oz)             Last Bowel Movement   No data recorded       Problem: Safety  Goal: Patient will be free from injury during hospitalization  Outcome: Progressing  Flowsheets (Taken 08/17/2021 2045)  Patient will be free from injury during hospitalization:   Assess patient's risk for falls and implement fall prevention plan of care per policy   Provide and maintain safe environment   Hourly rounding   Include patient/ family/ care giver in decisions related to safety  Goal: Patient will be free from infection during hospitalization  Outcome:  Progressing  Flowsheets (Taken 08/17/2021 2045)  Free from Infection during hospitalization:   Assess and monitor for signs and symptoms of infection   Monitor lab/diagnostic results   Encourage patient and family to use good hand hygiene technique   Monitor all insertion sites (i.e. indwelling lines, tubes, urinary catheters, and drains)     Problem: Compromised Hemodynamic Status  Goal: Vital signs and fluid balance maintained/improved  Outcome: Progressing  Flowsheets (Taken 08/17/2021 2045)  Vital signs and fluid balance are maintained/improved:   Position patient for maximum circulation/cardiac output   Monitor/assess vitals and hemodynamic parameters with position changes   Monitor and compare daily weight   Monitor intake and output. Notify LIP if urine output is less than 30 mL/hour.   Monitor/assess lab values and report abnormal values     Problem: Inadequate Gas Exchange  Goal: Adequate oxygenation and improved ventilation  Outcome: Progressing  Flowsheets (Taken 08/17/2021 2045)  Adequate oxygenation and improved ventilation:  Assess lung sounds   Plan activities to conserve energy: plan rest periods   Teach/reinforce use of incentive spirometer 10 times per hour while awake, cough and deep breath as needed   Consult/collaborate with Respiratory Therapy   Position for maximum ventilatory efficiency   Provide mechanical and oxygen support to facilitate gas exchange  Goal: Patent Airway maintained  Outcome: Progressing  Flowsheets (Taken 08/17/2021 2045)  Patent airway maintained:   Position patient for maximum ventilatory efficiency   Provide adequate fluid intake to liquefy secretions   Reinforce use of ordered respiratory interventions (i.e. CPAP, BiPAP, Incentive Spirometer, Acapella, etc.)

## 2021-08-17 NOTE — Progress Notes (Addendum)
RRT notified of elevated lactic    Assessed at the bedside, pt is warm and dry. HR 108, RR 13, BP 131/62 and O2 sat 98% on RA. Lung sounds are diminished. Pt states she has some shortness of breath. Pt feels warm, rectal temp 97.8. MD notified of lactic 8.5, LR infusion ordered along with a bolus. Neb treatments ordered more frequently for SOB/asthma. Pt receiving solu-medrol and will start rocephin and zithromax.     Primary RN started fluid bolus and will start IVF. Recheck lactic.     Staff to call as needs arise.  Wilhelmenia Blase, RN, BSN, RRT

## 2021-08-17 NOTE — Progress Notes (Signed)
08/17/21 2104   Peak Flow   Peak Flow Post 0   Peak Flow Personal Best (l/min) 0 l/min   Green Zone (L/min) (80% of best) 0 l/min   Yellow Upper limit (l/min) (79% of Best) -1 l/min   Yellow lower limit (l/min) (50% of Best) 0 l/min   Red Zone (l/min) (49% of Best) -1 l/min   Pre and post therapy assessment   Therapy assessed Neb   Pre- Tx respirations 18   Pre- Tx bilateral breath sounds Clear   Location Specific No   Post assessment same as pre Yes   Performing Departments   Peak flow (2) performing department formula [id number]   Nebs given performing department formula [id number]     Pt not able to move Peak Flow indicator post breathing treatment.

## 2021-08-17 NOTE — Progress Notes (Signed)
Medicine Progress Note   [facility]  [employer]   Patient Name: Jasmine Moran, Jasmine Moran LOS: 0 days   Attending Physician: Sherlon Handing, MD PCP: Berenda Morale, MD   [facility] Course:                                                            Lucette Kratz is a 51 y.o. female patient  patient past medical history of hypertension asthma who presented to the ED with complaints of shortness of breath that worsened yesterday.  States that she has been having worsening productive cough of greenish sputum last week positive chills, nausea, vomiting.  She has been using her albuterol inhaler more often.  Denies any fever, chest pain, diarrhea, urinary symptoms.  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  She also reported that she was diagnosed with hypertension 2 weeks ago and was started on amlodipine.  Stated she has been diagnosed with asthma 3 years ago and has not had significant asthma attack until today.  He denies any smoking     Assessment and Plan:      #Acute hypoxic respiratory failure  #Acute asthma exacerbation  #Community-acquired pneumonia  #Elevated lactate  -Tachycardic, tachypneic, hypoxic on presentation in 9s with EMS, afebrile  -No leukocytosis  -CXR per my review Hypoinflation with mild right basilar atelectasis.  Possible left basilar pneumonia  -Status post DuoNebs, Solu-Medrol in the ED  -DuoNebs around-the-clock  -Solu-Medrol 40 twice daily  -sputum culture, new urine pneumococcal, urine Legionella antigen  -Azithromycin and ceftriaxone for community-acquired pneumonia  -Monitor peak flow meter  -Oxygen supplementation with goal O2 saturation more than 90-92%  -Patient has elevated lactate, which is trending up.  Latest lactate more than 8.  Patient started on IV fluid and given 500 ml LR in the morning.  Repeat lactate now 6.8.  Another 1 L LR ordered.  Will repeat lactate after that.  If patient  continued to have high lactate will discuss with intensivist to transfer to ICU/CCRU  -Discussed with rapid response nurse.  There is a wheezing on exam and ordered breathing treatment  -High risk of deterioration.  Ordered blood cultures     #Hypertension  Continue amlodipine    Disposition: Inpatient  DVT PPX: Medication VTE Prophylaxis Orders: enoxaparin (LOVENOX) syringe 40 mg  Mechanical VTE Prophylaxis Orders: Reason for no mechanical VTE prophylaxis - Treatment not indicated  Code:  Full Code       Subjective     Patient reported that she is feeling better with some shortness of breath.  Otherwise denies any other active issue or complaint.           Objective   Physical Exam:       Vitals: T:97.5 F (36.4 C) (Temporal), BP:120/80, HR:(!) 109, RR:19, SaO2:95%         General: Patient is awake. In no acute distress.  HEENT: No conjunctival drainage, vision is intact, anicteric sclera.  Neck: Supple, no thyromegaly.  Chest: CTA bilaterally. No rhonchi, no wheezing. No use of accessory muscles.  CVS: Normal rate and regular rhythm no murmurs, without JVD, no pitting edema, pulses palpable.  Abdomen: Soft, non-tender, no guarding or rigidity,  with normal bowel sounds.  Extremities: No calf swelling and no gross deformity.  Skin: Warm, dry, no rash and no worrisome lesions.  NEURO: No motor or sensory deficits.  Psychiatric: Alert, interactive, appropriate, normal affect.    Weight Monitoring 08/17/2021 08/17/2021 08/17/2021 08/17/2021   Height - 175.3 cm 175.3 cm -   Height Method - Stated - -   Weight 106.6 kg - 106.6 kg 105.779 kg   Weight Method Bed Scale - Bed Scale Bed Scale   BMI (calculated) - - 34.8 kg/m2 -           Intake/Output Summary (Last 24 hours) at 08/17/2021 1617  Last data filed at 08/17/2021 0553  Gross per 24 hour   Intake 1249 ml   Output --   Net 1249 ml     Body mass index is 34.42 kg/m.     Meds:     Current Facility-Administered Medications   Medication Dose Route Frequency     albuterol-ipratropium  3 mL Nebulization Q4H SCH    amLODIPine  5 mg Oral Daily    [START ON 08/18/2021] azithromycin  500 mg Oral Q24H    cefTRIAXone  1 g Intravenous Q24H    enoxaparin  40 mg Subcutaneous Q24H    lactated ringers  1,000 mL Intravenous Once    methylPREDNISolone  40 mg Intravenous Q12H SCH    sodium chloride (PF)  3 mL Intravenous Q12H SCH      lactated ringers 100 mL/hr at 08/17/21 1029     PRN Meds: naloxone, ondansetron **OR** ondansetron.     LABS:     Estimated Creatinine Clearance: 106.3 mL/min (based on SCr of 0.82 mg/dL).  Recent Labs   Lab 08/17/21  0058   WBC 7.8   RBC 6.93*   Hemoglobin 13.2   Hematocrit 42.4   MCV 61*   PLT CT 192         Recent Labs   Lab 08/17/21  0058   Troponin I <0.01     No results found for: HGBA1CPERCNT  Recent Labs   Lab 08/17/21  0058   Glucose 118*   Sodium 142   Potassium 3.4*   Chloride 108   CO2 23   BUN 11   Creatinine 0.82   EGFR 87   Calcium 9.2     Recent Labs   Lab 08/17/21  0058   Magnesium 1.8   Albumin 3.8   Protein, Total 7.2   Bilirubin, Total 0.6   Alkaline Phosphatase 92   ALT 42   AST (SGOT) 25     Recent Labs   Lab 08/17/21  0230   Urine Specific Gravity 1.010   pH, Urine 6.5   Protein, UR 30*   Glucose, UA Negative   Ketones UA Negative   Bilirubin, UA Negative   Blood, UA Trace*   Nitrite, UA Negative   Urobilinogen, UA 0.2   Leukocyte Esterase, UA Negative   WBC, UA 1   RBC, UA 2      Patient Lines/Drains/Airways Status       Active PICC Line / CVC Line / PIV Line / Drain / Airway / Intraosseous Line / Epidural Line / ART Line / Line / Wound / Pressure Ulcer / NG/OG Tube       Name Placement date Placement time Site Days    Peripheral IV 08/17/21 18 G Right Antecubital 08/17/21  0102  Antecubital  less than 1  XR Chest AP Portable    Result Date: 08/17/2021  1. Hypoinflation with mild right basilar atelectasis. There appear to be airspace opacifications at the left base and cannot exclude infiltrate.  ReadingStation:[facility]    Home Health Needs:  There are no questions and answers to display.       Nutrition assessment done in collaboration with Registered Dietitians:     Time spent:      Sherlon Handing, MD     08/17/21,4:17 PM   MRN: [id number]                                      CSN: [id number] DOB: 18-Nov-1970

## 2021-08-17 NOTE — ED Provider Notes (Addendum)
[facility]  Emergency Department       Patient Name: Jasmine Moran, Jasmine Moran Patient DOB:  01-May-1971   Encounter Date:  08/17/2021 Age: 51 y.o. female   Attending ED Physician: Dierdre Searles, D.O. MRN:  [id number]   Room:  N5/N5-A PCP: Berenda Morale, MD      Diagnosis / Disposition:   Final Impression  1. Acute respiratory failure with hypoxia    2. Severe asthma with acute exacerbation, unspecified whether persistent    3. Pneumonia of left lower lobe due to infectious organism    4. Syncope and collapse        Disposition  ED Disposition       ED Disposition   Admit    Condition   --    Date/Time   Wed Aug 17, 2021  4:06 AM    Comment   Service: Medicine [106]                 Follow up  No follow-up provider specified.    Prescriptions  New Prescriptions    No medications on file         History of Presenting Illness:   Chief complaint: Asthma    HPI/ROS is limited by: urgency and decreased mental status  HPI/ROS given by: Family and EMS    Location: generalized  Quality: sob  Duration: just PTA  Severity: severe    Interpreter used via AMN.       Nursing Notes reviewed and acknowledged.    Jasmine Moran is a 51 y.o. female who presents with EMS for respiratory distress. Patient with LOC per EMS. Her pulse ox was in the 70s on their arrival with severe respiratory distress. She was given Albuterol, Duoneb, and IV solumedrol with some improvement.     In addition to the above history, please see nursing notes. Allergies, meds, past medical, family, social hx, and the results of the diagnostic studies performed have been reviewed by myself.      Review of Systems   Unable to obtain from patient.     All other systems reviewed and negative except as above, pertinent findings in HPI.          Allergies / Medications:   Pt has No Known Allergies.    Current/Home Medications    ALBUTEROL SULFATE HFA (PROVENTIL) 108 (90 BASE) MCG/ACT INHALER    Inhale 2 puffs into the lungs every 8 (eight) hours as needed for  Wheezing    AMLODIPINE (NORVASC) 5 MG TABLET    Take 5 mg by mouth daily Pt is taking amlodipine-besylate         Past History:   Medical: Pt has a past medical history of Asthma and Hypertension.    Surgical: Pt  has no past surgical history on file.    Family: The family history is not on file.    Social: Pt reports that she has never smoked. She does not have any smokeless tobacco history on file. She reports that she does not drink alcohol and does not use drugs.      Physical Exam:   Constitutional: Vital signs reviewed. +ill appearing, sleeping but easily arousable  Head: Normocephalic, atraumatic  Eyes: Conjunctiva and sclera are normal.  No injection or discharge.  Ears, Nose, Throat:  Normal external examination of the nose and ears.    Neck: Supple. No JVD.  Respiratory/Chest: + respiratory distress. +expiratory wheezes b/l  Cardiac: regular rhythm, +tachycardia  Abdomen:  soft and nontender  Back:  no CVAT  Extremities:  no edema, positive pulses with good capillary refill, good ROM  Neurological: No sensory or motor deficits. Speech normal.  Skin: Warm and dry. No rash.  Psychiatric: Alert and conversant.  Flat affect.          MDM:   Number and Complexity of Problems Addressed (select at least one)    Complexity: High: 1 or more chronic illnesses with severe exacerbation, progression, or side effects of treatment     Presenting acute/chronic problems: acute hypoxic respiratory failure    Differential diagnosis to include but not limited to: asthma, COPD, bronchospasm, bronchitis, hyperventilation, allergic reaction, myocardial infarction, acute respiratory failure, CHF/pulmonary edema, pericardial effusion, tamponade, pulmonary embolus, pneumothorax, pneumonia, diabetic ketoacidosis    Chronic illness impacting care (obesity, diabetes, hypertension, elderly - state impact): asthma that affects her acute respiratory  failure    ______________________________________________________________________  Amount/complexity of Data Reviewed   L\  Look below for information    History obtained from another historian (parent, spouse,  care giver, ems) : spouse, EMS    Review of older external records and found this relevant information: No old records available    Independent interpretation of  EKG:     EKG Interpretation  EKG interpreted independently by me:    Rate: Normal, HR 98  Rhythm: sinus rhythm    No Old EKG available for comparison.    Independent visualized and interpretation of radiological study by me:     Type of radiological study performed : CXR  Independent Interpretation by me: LLL pneumonia    Diagnostic tests appropriately considered even if not ultimately performed: None    Discussion of management with other physicians and/or providers:   Discussion with  [facility]. Patient condition and all pertinent labs and/or radiology studies discussed with physician.     ______________________________________________________________________    Risk of Complications and/or Morbidity or Mortality of Patient Management  (select one if applicable)    Risk: Decision regarding hospitalization or escalation of hospital level of care            Course in ED:   Patient is given duonebs and magnesium in the ED with relief. Patient is more alert and feels better. Patient started on IV Rocephin and Zithromax for LLL pneumonia. Dimer and troponin negative. VS improved and stable. EKG without acute changes. Covid negative. On re-exam: lungs CTAB with good air exchange. IVF ordered for elevated lactic acid.    Husband states patient was treated with steroids, cough medicine, and albuterol inhaler for fever and cough last week. Patient seemed to improved. He states tonight patient woke up to go to the bathroom when she screamed for him. He found her with decreased responsiveness and difficulty breathing. Husband states she has never  been intubated or hospitalized for her asthma. No prior DVT or PE.       Diagnostic Results:   The results of the diagnostic studies have been reviewed by myself:    Radiologic Studies  XR Chest AP Portable    Result Date: 08/17/2021  1. Hypoinflation with mild right basilar atelectasis. There appear to be airspace opacifications at the left base and cannot exclude infiltrate. ReadingStation:[facility]     Lab Studies  Labs Reviewed   CBC AND DIFFERENTIAL - Abnormal; Notable for the following components:       Result Value    RBC 6.93 (*)     MCV 61 (*)  MCH 19 (*)     MCHC 31 (*)     RDW 14.4 (*)     MPV 5.9 (*)     All other components within normal limits   COMPREHENSIVE METABOLIC PANEL - Abnormal; Notable for the following components:    Potassium 3.4 (*)     Glucose 118 (*)     All other components within normal limits   VH URINALYSIS WITH MICROSCOPIC AND CULTURE IF INDICATED       - Abnormal; Notable for the following components:    Protein, UR 30 (*)     Blood, UA Trace (*)     All other components within normal limits   VH XPERT XPRESS  COV-2/FLU/RSV PLUS    Narrative:     Specimen source - Nasopharyngeal Swab     Influenza A tests are unable to distinguish between novel and seasonal influenza A.     A negative result for either Influenza A or B does not exclude influenza virus infection. Clinical correlation required.     All positive influenza tests (A or B) require placement of patient on droplet precaution isolation.   TROPONIN I   B-TYPE NATRIURETIC PEPTIDE   MAGNESIUM   VH EXTRA SPECIMEN LABEL   D-DIMER, QUANTITATIVE             EKG/ Procedure / Critical Care time:     Total time providing critical care: 40 MINUTES due to acute hypoxic respiratory failure, complex decision making, continuous monitoring and reassessment, potential for cardiopulmonary deterioration, discussions with the family/patient, separate from billable procedures.       ATTESTATIONS   This chart was generated by an EMR and may  contain errors or additions/omissions not intended by the user.       Dierdre Searles, D.O.     Dierdre Searles, DO  08/19/21 1941       Dierdre Searles, DO  08/19/21 1946

## 2021-08-18 ENCOUNTER — Encounter: Payer: Self-pay | Admitting: Student in an Organized Health Care Education/Training Program

## 2021-08-18 LAB — COMPREHENSIVE METABOLIC PANEL
ALT: 32 U/L (ref 0–55)
AST (SGOT): 16 U/L (ref 10–42)
Albumin/Globulin Ratio: 1.1 Ratio (ref 0.80–2.00)
Albumin: 3.4 gm/dL — ABNORMAL LOW (ref 3.5–5.0)
Alkaline Phosphatase: 70 U/L (ref 40–145)
Anion Gap: 11.5 mMol/L (ref 7.0–18.0)
BUN / Creatinine Ratio: 13.9 Ratio (ref 10.0–30.0)
BUN: 10 mg/dL (ref 7–22)
Bilirubin, Total: 0.7 mg/dL (ref 0.1–1.2)
CO2: 22.3 mMol/L (ref 20.0–30.0)
Calcium: 9.3 mg/dL (ref 8.5–10.5)
Chloride: 109 mMol/L (ref 98–110)
Creatinine: 0.72 mg/dL (ref 0.60–1.20)
EGFR: 102 mL/min/{1.73_m2} (ref 60–150)
Globulin: 3.1 gm/dL (ref 2.0–4.0)
Glucose: 167 mg/dL — ABNORMAL HIGH (ref 71–99)
Osmolality Calculated: 280 mOsm/kg (ref 275–300)
Potassium: 3.8 mMol/L (ref 3.5–5.3)
Protein, Total: 6.5 gm/dL (ref 6.0–8.3)
Sodium: 139 mMol/L (ref 136–147)

## 2021-08-18 LAB — ECG 12-LEAD
P Wave Axis: 49 deg
P-R Interval: 165 ms
Patient Age: 50 years
Q-T Interval(Corrected): 463 ms
Q-T Interval: 362 ms
QRS Axis: 27 deg
QRS Duration: 102 ms
T Axis: 3 years
Ventricular Rate: 98 //min

## 2021-08-18 LAB — CBC AND DIFFERENTIAL
Basophils %: 0.6 % (ref 0.0–3.0)
Basophils Absolute: 0.1 10*3/uL (ref 0.0–0.3)
Eosinophils %: 0 % (ref 0.0–7.0)
Eosinophils Absolute: 0 10*3/uL (ref 0.0–0.8)
Hematocrit: 39.7 % (ref 36.0–48.0)
Hemoglobin: 12.4 gm/dL (ref 12.0–16.0)
Lymphocytes Absolute: 0.7 10*3/uL (ref 0.6–5.1)
Lymphocytes: 4.6 % — ABNORMAL LOW (ref 15.0–46.0)
MCH: 19 pg — ABNORMAL LOW (ref 28–35)
MCHC: 31 gm/dL — ABNORMAL LOW (ref 32–36)
MCV: 61 fL — ABNORMAL LOW (ref 80–100)
MPV: 6.2 fL (ref 6.0–10.0)
Monocytes Absolute: 0.2 10*3/uL (ref 0.1–1.7)
Monocytes: 1.1 % — ABNORMAL LOW (ref 3.0–15.0)
Neutrophils %: 93.6 % — ABNORMAL HIGH (ref 42.0–78.0)
Neutrophils Absolute: 15.1 10*3/uL — ABNORMAL HIGH (ref 1.7–8.6)
PLT CT: 185 10*3/uL (ref 130–440)
RBC: 6.48 10*6/uL — ABNORMAL HIGH (ref 3.80–5.00)
RDW: 14.8 % — ABNORMAL HIGH (ref 11.0–14.0)
WBC: 16.1 10*3/uL — ABNORMAL HIGH (ref 4.0–11.0)

## 2021-08-18 LAB — LACTIC ACID, PLASMA: Lactic Acid: 2.3 mMol/L (ref 0.5–1.9)

## 2021-08-18 LAB — VH EXTRA SPECIMEN LABEL

## 2021-08-18 LAB — PHOSPHORUS: Phosphorus: 2.8 mg/dL (ref 2.3–4.7)

## 2021-08-18 LAB — MAGNESIUM: Magnesium: 1.8 mg/dL (ref 1.6–2.6)

## 2021-08-18 MED ORDER — AMOXICILLIN-POT CLAVULANATE 875-125 MG PO TABS
1.0000 | ORAL_TABLET | Freq: Two times a day (BID) | ORAL | 0 refills | Status: AC
Start: 2021-08-18 — End: 2021-08-22

## 2021-08-18 MED ORDER — PREDNISONE 20 MG PO TABS
40.0000 mg | ORAL_TABLET | Freq: Every morning | ORAL | 0 refills | Status: AC
Start: 2021-08-19 — End: 2021-08-22

## 2021-08-18 MED ORDER — AZITHROMYCIN 250 MG PO TABS
500.0000 mg | ORAL_TABLET | ORAL | 0 refills | Status: AC
Start: 2021-08-19 — End: 2021-08-22

## 2021-08-18 NOTE — Progress Notes (Signed)
Readmission Risk  [facility] - GI/ENDO/GEN MED   Patient Name: Jasmine Moran   Attending Physician: Sherlon Handing, MD   Today's date:   08/18/2021 LOS: 1 days   Expected Discharge Date      Readmission Assessment:                                                              Discharge Planning  ReAdmit Risk Score: 9.5  Does the patient have perscription coverage?: No  Confirmed PCP with Pt: Yes  Confirmed PCP name: Thu  Last PCP Visit Date: Two weeks ago  Confirm Transport to F/U Appt.: Self/Private Vehicle/Friend  Anticipated Home Health at [facility]: No, Doesn't qualify  Anticipated Placement at [facility]: No  CM Comments: 08/18/21 RNCM (MOF): Admitted with Asthma Exacerbation, Pneumonia. IV fluids, IV ABX, IV Steroids, DuoNeb. O2 on RA. PMH: Asthma, HTN. IDPA screening questions completed with assist from [facility] #[id number]. PTA: Lives with son and family. Independent with ADL's and IADL's. Does not drive; Son transports pt to appointments and job. Employed at a hotel. Has PCP. Unfunded but states able to afford meds, [facility] [address]. Plan for d/c is to return home with family support. CM following.  Advance Directive Readdressed: No       IDPA:   Patient Type  Within 30 Days of Previous Admission?: No  Healthcare Decisions  Interviewed:: Patient, Family  Orientation/Decision Making Abilities of Patient: Alert and Oriented x3, able to make decisions  Advance Directive: Patient does not have advance directive  Advance Directive not in Chart: Copy requested from other (Comment)  Healthcare Agent Appointed: No  Prior to admission  Prior level of function: Independent with ADLs, Ambulates independently  Type of Residence: Private residence  Home Layout: One level, Stairs to enter with rails (add number in comment) (7)  Have running water, electricity, heat, etc?: Yes  Living Arrangements: Children, Family members  How do you get to your MD appointments?: Son  How do you get your groceries?: Pt  Who  fixes your meals?: Pt  Who does your laundry?: Pt  Who picks up your prescriptions?: Pt  Dressing: Independent  Grooming: Independent  Feeding: Independent  Bathing: Independent  Toileting: Independent  Home Care/Community Services: None  Discharge Planning  Support Systems: Children, Spouse/significant other, Family members  Patient expects to be discharged to:: Home  Anticipated [place] plan discussed with:: Same as interviewed  Mode of transportation:: Private car (family member)  Does the patient have perscription coverage?: No  Consults/Providers  PT Evaluation Needed: No  OT Evalulation Needed: No  SLP Evaluation Needed: No  Correct PCP listed in Epic?: Yes  Family and PCP  PCP on file was verified as the current PCP?: Yes   30 Day Readmission:       Provider Notifications:        Darryll Capers, MSN, RN  Nurse Case Manager  [phone]

## 2021-08-18 NOTE — Plan of Care (Addendum)
NURSE NOTE SUMMARY  [facility] - GI/ENDO/GEN MED   Patient Name: Jasmine Moran   Attending Physician: Sherlon Handing, MD   Today's date:   08/18/2021 LOS: 1 days   Shift Summary:                                                              1610 - Assumed pt care. VSS. Pt has no s/s of distress. Pt has no needs at this time. Fall precautions in place & call bell w/in reach.     9604 - Pt receiving neb treatment at this time, pt declines needs.     1007 - Pt assessment complete. Medications given per [facility] w/out complication. Pt expressed all needs are met at this time. Fall precautions in place & call bell w/in reach.     1220 - Pt ambulated throughout the hallways, on RA, O2 remained 96%. Pt has no c/o SOB or dizziness during this time. MD made aware.     1555 - Pt VSS for d/c. D/c instructions were given by writer, pt expressed understanding. All pt questions were answered. Pt able to teach back d/c instructions. IV removed, catheter intact. Transport request put in. Pt awaiting transport.      Provider Notifications:        Rapid Response Notifications:  Mobility:      PMP Activity: Step 6 - Walks in Room (08/18/2021  8:08 AM)     Weight tracking:  Family Dynamic:   Last 3 Weights for the past 72 hrs (Last 3 readings):   Weight   08/17/21 1554 105.8 kg (233 lb 3.2 oz)   08/17/21 1500 106.6 kg (235 lb 0.2 oz)   08/17/21 0059 106.6 kg (235 lb 0.2 oz)             Last Bowel Movement   No data recorded        Problem: Safety  Goal: Patient will be free from injury during hospitalization  Outcome: Progressing  Flowsheets (Taken 08/17/2021 2045 by Lorenda Hatchet, RN)  Patient will be free from injury during hospitalization:   Assess patient's risk for falls and implement fall prevention plan of care per policy   Provide and maintain safe environment   Hourly rounding   Include patient/ family/ care giver in decisions related to safety  Goal: Patient will be free from infection during  hospitalization  Outcome: Progressing  Flowsheets (Taken 08/17/2021 2045 by Lorenda Hatchet, RN)  Free from Infection during hospitalization:   Assess and monitor for signs and symptoms of infection   Monitor lab/diagnostic results   Encourage patient and family to use good hand hygiene technique   Monitor all insertion sites (i.e. indwelling lines, tubes, urinary catheters, and drains)     Problem: Compromised Hemodynamic Status  Goal: Vital signs and fluid balance maintained/improved  Outcome: Progressing  Flowsheets (Taken 08/17/2021 2045 by Lorenda Hatchet, RN)  Vital signs and fluid balance are maintained/improved:   Position patient for maximum circulation/cardiac output   Monitor/assess vitals and hemodynamic parameters with position changes   Monitor and compare daily weight   Monitor intake and output. Notify LIP if urine output is less than 30 mL/hour.   Monitor/assess lab values and report abnormal values  Problem: Inadequate Gas Exchange  Goal: Adequate oxygenation and improved ventilation  Outcome: Progressing  Flowsheets (Taken 08/17/2021 2045 by Lorenda Hatchet, RN)  Adequate oxygenation and improved ventilation:   Assess lung sounds   Plan activities to conserve energy: plan rest periods   Teach/reinforce use of incentive spirometer 10 times per hour while awake, cough and deep breath as needed   Consult/collaborate with Respiratory Therapy   Position for maximum ventilatory efficiency   Provide mechanical and oxygen support to facilitate gas exchange  Goal: Patent Airway maintained  Outcome: Progressing  Flowsheets (Taken 08/17/2021 2045 by Lorenda Hatchet, RN)  Patent airway maintained:   Position patient for maximum ventilatory efficiency   Provide adequate fluid intake to liquefy secretions   Reinforce use of ordered respiratory interventions (i.e. CPAP, BiPAP, Incentive Spirometer, Acapella, etc.)

## 2021-08-18 NOTE — Discharge Summary (Signed)
Medicine Discharge Summary   [facility]  [employer]   Patient Name: Jasmine Moran,Jasmine Moran   Attending Physician: Sherlon HandingSadiq, Richard Ritchey, MD PCP: Berenda Moralehu, Tin M, MD   Date of Admission: 08/17/2021 D/C Date: 08/18/2021   Discharge Diagnoses:   #Acute asthma exacerbation  #Community-acquired pneumonia  #Elevated lactate  #Hypertension     Hospital Course       Jasmine Moran is a 51 y.o. female patient that was admitted on 08/17/2021 past medical history of hypertension asthma who presented to the ED with complaints of shortness of breath that worsened yesterday.  States that she has been having worsening productive cough of greenish sputum last week positive chills, nausea, vomiting.  She has been using her albuterol inhaler more often.  Denies any fever, chest pain, diarrhea, urinary symptoms.  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  She also reported that she was diagnosed with hypertension 2 weeks ago and was started on amlodipine.  Stated she has been diagnosed with asthma 3 years ago and has not had significant asthma attack until day of admission.  Patient started empirically on antibiotic for pneumonia coverage and breathing treatment steroid for asthma exacerbation.  Labs reviewed showed persistent elevation of lactic acid and patient is aggressively resuscitated with IV boluses and continuous which brought down lactic acid to normal.  Overall patient is feeling better.  On room air.  Able to walk around in the room and hallway without any problem.  Patient discharged home in stable condition with oral antibiotic.  Advised to follow-up with primary care doctor as needed.    Principal Problem:    Asthma exacerbation  Resolved Problems:    * No resolved hospital problems. *    Pending Results and other significant studies:  None     Discharge Instructions:          Disposition: Home  Diet: Consistent Carbohydrates  Activity: As  tolerated  Discharge Code Status: Full Code    Berenda Moralehu, Tin M, MD  [address]  [place] [id number]  [phone]    Follow up  As needed       Discharge Medications:                                                                        Discharge Medication List        Taking      albuterol sulfate HFA 108 (90 Base) MCG/ACT inhaler  Dose: 2 puff  Commonly known as: PROVENTIL  Inhale 2 puffs into the lungs every 8 (eight) hours as needed for Wheezing     amLODIPine 5 MG tablet  Dose: 5 mg  Commonly known as: NORVASC  For: High Blood Pressure Disorder  Take 5 mg by mouth daily Pt is taking amlodipine-besylate     amoxicillin-clavulanate 875-125 MG per tablet  Dose: 1 tablet  Commonly known as: AUGMENTIN  For: Pneumonia  Take 1 tablet by mouth 2 (two) times daily for 4 days     azithromycin 250 MG tablet  Dose: 500 mg  Commonly known as: ZITHROMAX  Start taking on: August 19, 2021  Take 2 tablets (500  mg) by mouth every 24 hours for 3 days     predniSONE 20 MG tablet  Dose: 40 mg  Commonly known as: DELTASONE  For: Asthma  Start taking on: August 19, 2021  Take 2 tablets (40 mg) by mouth every morning with breakfast for 3 days               Discharge Day Exam (08/18/2021):     Blood pressure 146/88, pulse 92, temperature 97.9 F (36.6 C), temperature source Temporal, resp. rate 18, height 1.753 m (5' 9.02"), weight 105.8 kg (233 lb 3.2 oz), SpO2 97 %.             General: Patient is awake. In no acute distress.  Chest: CTA bilaterally. No rhonchi, no wheezing. No use of accessory muscles.  CVS: Normal rate and regular rhythm no murmurs, without JVD.  Abdomen: Soft, non-tender, no guarding or rigidity, with normal bowel sounds.  Extremities: No pitting edema, pulses palpable, no calf swelling and no gross deformity.  Skin: Warm, dry  NEURO: No motor or sensory deficits.     Recent Labs      Recent Labs   Lab 08/18/21  0602 08/17/21  0058   WBC 16.1* 7.8   RBC 6.48* 6.93*   Hemoglobin 12.4 13.2   Hematocrit  39.7 42.4   MCV 61* 61*   PLT CT 185 192         Recent Labs   Lab 08/17/21  0058   Troponin I <0.01     No results found for: HGBA1CPERCNT  Recent Labs   Lab 08/18/21  0602 08/17/21  0058   Glucose 167* 118*   Sodium 139 142   Potassium 3.8 3.4*   Chloride 109 108   CO2 22.3 23   BUN 10 11   Creatinine 0.72 0.82   EGFR 102 87   Calcium 9.3 9.2     Recent Labs   Lab 08/18/21  0602 08/17/21  0058   Magnesium 1.8 1.8   Phosphorus 2.8  --    Albumin 3.4* 3.8   Protein, Total 6.5 7.2   Bilirubin, Total 0.7 0.6   Alkaline Phosphatase 70 92   ALT 32 42   AST (SGOT) 16 25        Allergies:      Patient has no known allergies.   Time spent on discharging the patient:  35 minutes   XR Chest AP Portable    Result Date: 08/17/2021  1. Hypoinflation with mild right basilar atelectasis. There appear to be airspace opacifications at the left base and cannot exclude infiltrate. ReadingStation:[email or web address]    Home Health Needs:  There are no questions and answers to display.      Sherlon Handing, MD         08/18/21 3:20 PM   MRN: [id number]                                      CSN: [id number] DOB: 1970-08-16

## 2021-08-18 NOTE — UM Notes (Signed)
[facility]    [facility]    NAME: Jasmine Moran  MR#: [id number]    CSN#: [id number]    ROOM: 537/537-A AGE: 51 y.o.    Date of Birth: 12/19/1970    ADMIT DATE AND TIME: 08/17/2021 12:52 AM    PATIENT CLASS: Inpatient 08/17/2021 @ 0444    ATTENDING PHYSICIAN: Sherlon HandingSadiq, Imtiaz, MD  PAYOR:Payor: /       AUTH #:     DIAGNOSIS:     ICD-10-CM    1. Acute respiratory failure with hypoxia  J96.01       2. Severe asthma with acute exacerbation, unspecified whether persistent  J45.901       3. Pneumonia of left lower lobe due to infectious organism  J18.9       4. Syncope and collapse  R55           HISTORY:   Past Medical History:   Diagnosis Date    Asthma     Hypertension      M-60    Patient presents with complaints of shortness of breath that worsened yesterday. Patient states that she has been having worsening productive cough of greenish sputum last week, chills, nausea, and vomiting. Patient reports using her albuterol inhaler more often.    08/17/2021  Assessment and Plan:                                                                 #Acute hypoxic respiratory failure  #Acute asthma exacerbation  #Community-acquired pneumonia  -Tachycardic, tachypneic, hypoxic on presentation in 70s with EMS, afebrile  -No leukocytosis  -CXR per my review Hypoinflation with mild right basilar atelectasis.  Possible left basilar pneumonia  -Status post DuoNebs, Solu-Medrol in the ED  -DuoNebs around-the-clock  -Solu-Medrol 40 twice daily  -sputum culture, new urine pneumococcal, urine Legionella antigen  -Azithromycin and ceftriaxone for community-acquired pneumonia  -Ordered ABGs  -Monitor peak flow meter  -Oxygen supplementation with goal O2 saturation more than 90-92%     #Hypertension  Continue amlodipine     DVT PPx: Medication VTE Prophylaxis Orders: enoxaparin (LOVENOX) syringe 40 mg  Mechanical VTE Prophylaxis Orders: Reason for no mechanical VTE prophylaxis -  Treatment not indicated  Dispo: Inpatient  Healthcare Proxy: Ulloa,Carlos   Code: full code     LABS: RBC 6.93 Glucose 118 Potassium 3.4 UA: Protein 30 Blood Trace     XR Chest AP Portable IMPRESSION:      1. Hypoinflation with mild right basilar atelectasis. There appear to be airspace opacifications at the left base and cannot exclude infiltrate.    LABS: @0441  Lactic Acid 4.9   LABS: @0617  Lactic Acid 6.1  LABS: @0914  Lactic Acid 8.5  LABS: @1409  Lactic Acid 6.6   LABS: @1754  Lactic Acid 5.0 Blood Cultures: Pending     VITALS: T97.7 P102 R16 BP136/79 Sat 100% 9L O2    Assessment and Plan:       #Acute hypoxic respiratory failure  #Acute asthma exacerbation  #Community-acquired pneumonia  #Elevated lactate  -Tachycardic, tachypneic, hypoxic on presentation in 5970s with EMS, afebrile  -No leukocytosis  -CXR per my review Hypoinflation with mild right basilar atelectasis.  Possible left basilar pneumonia  -Status post  DuoNebs, Solu-Medrol in the ED  -DuoNebs around-the-clock  -Solu-Medrol 40 twice daily  -sputum culture, new urine pneumococcal, urine Legionella antigen  -Azithromycin and ceftriaxone for community-acquired pneumonia  -Monitor peak flow meter  -Oxygen supplementation with goal O2 saturation more than 90-92%  -Patient has elevated lactate, which is trending up.  Latest lactate more than 8.  Patient started on IV fluid and given 500 ml LR in the morning.  Repeat lactate now 6.8.  Another 1 L LR ordered.  Will repeat lactate after that.  If patient continued to have high lactate will discuss with intensivist to transfer to ICU/CCRU  -Discussed with rapid response nurse.  There is a wheezing on exam and ordered breathing treatment  -High risk of deterioration.  Ordered blood cultures     #Hypertension  Continue amlodipine     Disposition: Inpatient  DVT PPX: Medication VTE Prophylaxis Orders: enoxaparin (LOVENOX) syringe 40 mg  Mechanical VTE Prophylaxis Orders: Reason for no mechanical VTE prophylaxis -  Treatment not indicated  Code:            Full Code        ED TREATMENT: DuoNeb 48ml x 3 Zithromax 500mg  IV Rocephin 1g IV 1 NS IV bolus x 2     MEDICATIONS: DuoNeb 30ml q 6 hrs changed to DuoNeb 62ml q 4 hrs Norvasc 5mg  PO Daily Rocephin 1g IV q 24 hrs Lovenox 40mg  q 24 hrs magnesium sulfate 2g IV x 1 Solumedrol 40mg  IV q 12 hrs Sodium Chloride 7ml IV q 12 hrs LR IV bolus x 1  LR IV bolus x 1 LR@125ml /hr     DATE OF REVIEW: 08/18/2021    VITALS: BP 146/88    Pulse 92    Temp 97.9 F (36.6 C) (Temporal)    Resp 18    Ht 1.753 m (5' 9.02")    Wt 105.8 kg (233 lb 3.2 oz)    LMP  (LMP Unknown)    SpO2 97%    BMI 34.42 kg/m     Active Hospital Problems    Diagnosis    Asthma exacerbation     LABS: WBC 16.1 RBC 6.48 Glucose 167 Lactic Acid 2.3 Albumin 3.4     MEDICATIONS: Scheduled Meds:  Current Facility-Administered Medications   Medication Dose Route Frequency    albuterol-ipratropium  3 mL Nebulization Q4H SCH    amLODIPine  5 mg Oral Daily    azithromycin  500 mg Oral Q24H    cefTRIAXone  1 g Intravenous Q24H    enoxaparin  40 mg Subcutaneous Q24H    methylPREDNISolone  40 mg Intravenous Q12H SCH    sodium chloride (PF)  3 mL Intravenous Q12H [facility]     Continuous Infusions:   lactated ringers 125 mL/hr at 08/18/21 0607     PRN Meds:.naloxone, ondansetron **OR** ondansetron    Granite Godman L. 08/20/2021 RN BSN  05-05-[employer]   Virtual Utilization Review Specialist  For [facility]    Office: [phone] Fax: [phone]

## 2021-08-18 NOTE — Progress Notes (Signed)
Patient unable to use peak flow correctly. Patient denies any shortness of breath or difficulty breathing. Will continue to attempt to gather peak flow parameters.      08/18/21 0759   Medication Therapy Tx   $ How many nebs given? 1   Duration 10   Delivery Source Air   Position High fowlers   Treatment Tolerance Tolerated well   Results of therapy No change   Adverse Reactions None   Peak Flow   Peak Flow Post 0   Peak Flow Personal Best (l/min) 0 l/min   Green Zone (L/min) (80% of best) 0 l/min   Yellow Upper limit (l/min) (79% of Best) -1 l/min   Yellow lower limit (l/min) (50% of Best) 0 l/min   Red Zone (l/min) (49% of Best) -1 l/min   Pre and post therapy assessment   Therapy assessed Neb   Pre- Tx respirations 15   Pre- Tx bilateral breath sounds Clear;Diminished   Pre- Tx breath sounds right Clear;Diminished   Pre- Tx breath sounds left Clear;Diminished   Location Specific No   Post assessment same as pre Yes   Cough / suctioning   Cough No cough effort   Performing Departments   Peak flow (2) performing department formula [id number]   Nebs given performing department formula [id number]

## 2021-08-22 LAB — VH CULTURE-BLOOD-VENIPUNCTURE
Culture Result: NO GROWTH
Culture Result: NO GROWTH

## 2022-01-18 ENCOUNTER — Ambulatory Visit
Admission: RE | Admit: 2022-01-18 | Discharge: 2022-01-18 | Disposition: A | Discharge status: Home / Self Care | Payer: Self-pay | Source: Ambulatory Visit

## 2022-01-18 ENCOUNTER — Inpatient Hospital Stay: Admission: RE | Admit: 2022-01-18 | Payer: Self-pay | Source: Ambulatory Visit

## 2022-01-18 DIAGNOSIS — J4540 Moderate persistent asthma, uncomplicated: Secondary | ICD-10-CM

## 2022-01-18 DIAGNOSIS — R0602 Shortness of breath: Secondary | ICD-10-CM | POA: Insufficient documentation

## 2022-01-18 DIAGNOSIS — J984 Other disorders of lung: Secondary | ICD-10-CM | POA: Insufficient documentation

## 2022-05-12 ENCOUNTER — Emergency Department: Payer: Self-pay

## 2022-05-12 ENCOUNTER — Emergency Department
Admission: EM | Admit: 2022-05-12 | Discharge: 2022-05-12 | Disposition: A | Discharge status: Home / Self Care | Payer: Self-pay | Attending: Emergency Medicine | Admitting: Emergency Medicine

## 2022-05-12 DIAGNOSIS — R059 Cough, unspecified: Secondary | ICD-10-CM | POA: Insufficient documentation

## 2022-05-12 DIAGNOSIS — R519 Headache, unspecified: Secondary | ICD-10-CM | POA: Insufficient documentation

## 2022-05-12 DIAGNOSIS — I10 Essential (primary) hypertension: Secondary | ICD-10-CM | POA: Insufficient documentation

## 2022-05-12 DIAGNOSIS — R0602 Shortness of breath: Secondary | ICD-10-CM | POA: Insufficient documentation

## 2022-05-12 DIAGNOSIS — J4531 Mild persistent asthma with (acute) exacerbation: Secondary | ICD-10-CM | POA: Insufficient documentation

## 2022-05-12 LAB — CBC AND DIFFERENTIAL
Basophils %: 0.1 % (ref 0.0–3.0)
Basophils Absolute: 0 10*3/uL (ref 0.0–0.3)
Eosinophils %: 1.2 % (ref 0.0–7.0)
Eosinophils Absolute: 0.1 10*3/uL (ref 0.0–0.8)
Hematocrit: 41.9 % (ref 36.0–48.0)
Hemoglobin: 13.3 gm/dL (ref 12.0–16.0)
Lymphocytes Absolute: 1.9 10*3/uL (ref 0.6–5.1)
Lymphocytes: 21.9 % (ref 15.0–46.0)
MCH: 19 pg — ABNORMAL LOW (ref 28–35)
MCHC: 32 gm/dL (ref 32–36)
MCV: 59 fL — ABNORMAL LOW (ref 80–100)
MPV: 6.6 fL (ref 6.0–10.0)
Monocytes Absolute: 0.4 10*3/uL (ref 0.1–1.7)
Monocytes: 4.3 % (ref 3.0–15.0)
Neutrophils %: 72.6 % (ref 42.0–78.0)
Neutrophils Absolute: 6.4 10*3/uL (ref 1.7–8.6)
PLT CT: 232 10*3/uL (ref 130–440)
RBC: 7.12 10*6/uL — ABNORMAL HIGH (ref 3.80–5.00)
RDW: 15.1 % — ABNORMAL HIGH (ref 11.0–14.0)
WBC: 8.8 10*3/uL (ref 4.0–11.0)

## 2022-05-12 LAB — ECG 12-LEAD
P Wave Axis: 63 deg
P-R Interval: 154 ms
Patient Age: 51 years
Q-T Interval(Corrected): 442 ms
Q-T Interval: 344 ms
QRS Axis: 8 deg
QRS Duration: 99 ms
T Axis: 33 years
Ventricular Rate: 99 //min

## 2022-05-12 LAB — BASIC METABOLIC PANEL
Anion Gap: 17.4 mMol/L (ref 7.0–18.0)
BUN / Creatinine Ratio: 15.4 Ratio (ref 10.0–30.0)
BUN: 14 mg/dL (ref 7–22)
CO2: 19 mMol/L — ABNORMAL LOW (ref 20–30)
Calcium: 9.6 mg/dL (ref 8.5–10.5)
Chloride: 105 mMol/L (ref 98–110)
Creatinine: 0.91 mg/dL (ref 0.60–1.20)
EGFR: 76 mL/min/{1.73_m2} (ref 60–150)
Glucose: 200 mg/dL — ABNORMAL HIGH (ref 71–99)
Osmolality Calculated: 282 mOsm/kg (ref 275–300)
Potassium: 3.4 mMol/L — ABNORMAL LOW (ref 3.5–5.3)
Sodium: 138 mMol/L (ref 136–147)

## 2022-05-12 LAB — TROPONIN I: Troponin I: <0.01 ng/mL (ref 0.00–0.02)

## 2022-05-12 LAB — B-TYPE NATRIURETIC PEPTIDE: B-Natriuretic Peptide: <10 pg/mL (ref 0.0–100.0)

## 2022-05-12 LAB — VH EXTRA SPECIMEN LABEL

## 2022-05-12 MED ORDER — ALBUTEROL SULFATE (2.5 MG/3ML) 0.083% IN NEBU
INHALATION_SOLUTION | RESPIRATORY_TRACT | Status: AC
Start: 2022-05-12 — End: ?
  Filled 2022-05-12: qty 3

## 2022-05-12 MED ORDER — ALBUTEROL-IPRATROPIUM 2.5-0.5 (3) MG/3ML IN SOLN
RESPIRATORY_TRACT | Status: AC
Start: 2022-05-12 — End: ?
  Filled 2022-05-12: qty 3

## 2022-05-12 MED ORDER — PREDNISONE 20 MG PO TABS
60.0000 mg | ORAL_TABLET | Freq: Every day | ORAL | 0 refills | Status: AC
Start: 2022-05-12 — End: 2022-05-17

## 2022-05-12 MED ORDER — PREDNISONE 20 MG PO TABS
ORAL_TABLET | ORAL | Status: AC
Start: 2022-05-12 — End: ?
  Filled 2022-05-12: qty 3

## 2022-05-12 MED ORDER — PREDNISONE 20 MG PO TABS
60.0000 mg | ORAL_TABLET | Freq: Once | ORAL | Status: AC
Start: 2022-05-12 — End: 2022-05-12
  Administered 2022-05-12: 60 mg via ORAL

## 2022-05-12 MED ORDER — ALBUTEROL SULFATE HFA 108 (90 BASE) MCG/ACT IN AERS
2.0000 | INHALATION_SPRAY | Freq: Three times a day (TID) | RESPIRATORY_TRACT | 1 refills | Status: AC | PRN
Start: 2022-05-12 — End: ?

## 2022-05-12 MED ORDER — ALBUTEROL-IPRATROPIUM 2.5-0.5 (3) MG/3ML IN SOLN
3.0000 mL | Freq: Once | RESPIRATORY_TRACT | Status: AC
Start: 2022-05-12 — End: 2022-05-12
  Administered 2022-05-12: 3 mL via RESPIRATORY_TRACT

## 2022-05-12 MED ORDER — ALBUTEROL SULFATE (2.5 MG/3ML) 0.083% IN NEBU
2.5000 mg | INHALATION_SOLUTION | Freq: Once | RESPIRATORY_TRACT | Status: AC
Start: 2022-05-12 — End: 2022-05-12
  Administered 2022-05-12: 2.5 mg via RESPIRATORY_TRACT

## 2022-05-12 NOTE — ED Triage Notes (Signed)
Pt presents to ED for shortness of breath. Pt reports increased SOB starting six days ago. Pt uses albuterol inhaler daily, but has not gotten any relief. Pt reports she has been using inhaler every 4 hours. Pt also reports chest tightness. Pt denies dizziness/headache/nausea/vomiting/diarrhea.

## 2022-05-12 NOTE — ED Provider Notes (Signed)
ED DOC NOTE     History provided by the patient and interpreter utilized  History is limited by none    HPI     51 y.o. female shortness of breath.  Longstanding history of asthma and shortness of breath.  States been getting worse last 6 days.  She been using albuterol at home twice the morning twice an evening without relief.  She received treatments prior to my evaluation in the room states she feels much better.  She has not had any fevers.  She had a mild dry cough.  States she has a headache and some discomfort from coughing.  States her legs feel sore.  No swelling in her legs.  No history of blood clots.      Review of Systems     Review of Systems  No abdominal pain nausea vomiting diarrhea.      PAST MEDICAL/SURGICAL HISTORY:  Past Medical History:   Diagnosis Date    Asthma     Hypertension       History reviewed. No pertinent surgical history.  Additional Past Medical/Surgical History:       SOCIAL AND FAMILY HISTORY:  Social History     Tobacco Use    Smoking status: Never   Vaping Use    Vaping Use: Never used   Substance Use Topics    Alcohol use: Never    Drug use: Never     History reviewed. No pertinent family history.     Additional Social and Family History:  Additional SH: [x]   I reviewed SH above       Additional FH: [x]  I reviewed FH above    No Known Allergies  Additional Allergies:    Prior to Admission medications    Medication Sig Start Date End Date Taking? Authorizing Provider   albuterol sulfate HFA (PROVENTIL) 108 (90 Base) MCG/ACT inhaler Inhale 2 puffs into the lungs every 8 (eight) hours as needed for Wheezing    [provider]   amLODIPine (NORVASC) 5 MG tablet Take 5 mg by mouth daily Pt is taking amlodipine-besylate    [provider]       PHYSICAL EXAM   Vitals:    05/12/22 1553   BP:    Pulse: 93   Resp: (!) 24   Temp:    SpO2: 94%     [x]  Nursing note reviewed [x]  Vitals reviewed   Pulse Oximetry on Room Air Normal    Physical Exam  Vitals and nursing note  reviewed.   Constitutional:       Appearance: She is well-developed. She is obese.   HENT:      Head: Normocephalic and atraumatic.   Eyes:      Extraocular Movements: Extraocular movements intact.      Pupils: Pupils are equal, round, and reactive to light.   Cardiovascular:      Rate and Rhythm: Normal rate and regular rhythm.   Pulmonary:      Effort: Pulmonary effort is normal.      Breath sounds: Normal breath sounds.   Chest:      Chest wall: Tenderness (Right chest wall tenderness) present.   Abdominal:      Palpations: Abdomen is soft.   Musculoskeletal:         General: Normal range of motion.   Skin:     General: Skin is warm and dry.      Capillary Refill: Capillary refill takes less than 2 seconds.  Neurological:      General: No focal deficit present.      Mental Status: She is alert.   Psychiatric:         Mood and Affect: Mood normal.         LABS/TESTS:  Results       Procedure Component Value Units Date/Time    CBC and differential [[id number]]  (Abnormal) Collected: 05/12/22 1439    Specimen: Blood Updated: 05/12/22 1557     WBC 8.8 K/cmm      RBC 7.12 M/cmm      Hemoglobin 13.3 gm/dL      Hematocrit 19.1 %      MCV 59 fL      MCH 19 pg      MCHC 32 gm/dL      RDW 47.8 %      PLT CT 232 K/cmm      MPV 6.6 fL      Neutrophils % 72.6 %      Lymphocytes 21.9 %      Monocytes 4.3 %      Eosinophils % 1.2 %      Basophils % 0.1 %      Neutrophils Absolute 6.4 K/cmm      Lymphocytes Absolute 1.9 K/cmm      Monocytes Absolute 0.4 K/cmm      Eosinophils Absolute 0.1 K/cmm      Basophils Absolute 0.0 K/cmm      RBC Morphology RBC Morphology Reviewed     Microcytic 2+     Anisocytosis 1+     Polychromasia 1+     Hypochromia 1+    Troponin I [[id number]] Collected: 05/12/22 1439    Specimen: Plasma Updated: 05/12/22 1527     Troponin I <0.01 ng/mL     B-type Natriuretic Peptide [[id number]] Collected: 05/12/22 1439    Specimen: Plasma Updated: 05/12/22 1526     B-Natriuretic Peptide <10.0 pg/mL     Basic  Metabolic Panel [[id number]]  (Abnormal) Collected: 05/12/22 1439    Specimen: Plasma Updated: 05/12/22 1516     Sodium 138 mMol/L      Potassium 3.4 mMol/L      Chloride 105 mMol/L      CO2 19 mMol/L      Calcium 9.6 mg/dL      Glucose 401 mg/dL      Creatinine 0.27 mg/dL      BUN 14 mg/dL      Anion Gap 25.3 mMol/L      BUN / Creatinine Ratio 15.4 Ratio      EGFR 76 mL/min/1.62m2      Osmolality Calculated 282 mOsm/kg     Collect Blood [[id number]] Collected: 05/12/22 1439    Specimen: Other Updated: 05/12/22 1450     Collect Blood Label Notification              XR Chest AP Portable    Result Date: 05/12/2022  No acute intrathoracic pathology is demonstrated. ReadingStation:[email or web address]           EKG:   I have interpreted the EKG at the time it was performed and my official reading is as follows:  Last EKG Result       Procedure Component Value Units Date/Time    ECG 12 lead [[id number]] Collected: 05/12/22 1426     Updated: 05/12/22 1524     Patient Age 71 years      Patient DOB February 04, 1971     Patient Height --  Patient Weight --     Interpretation Text --     Sinus rhythm  Borderline T wave abnormalities  Compared to ECG 08/17/2021 00:59:13  No significant changes  Electronically Signed On 05-12-2022 15:23:42 EDT by Rolanda Jay       Physician Interpreter Rolanda Jay     Ventricular Rate 99 //min      QRS Duration 99 ms      P-R Interval 154 ms      Q-T Interval 344 ms      Q-T Interval(Corrected) 442 ms      P Wave Axis 63 deg      QRS Axis 8 deg      T Axis 33 years               ED COURSE:  BP 114/74   Pulse 93   Temp 97.2 F (36.2 C) (Skin)   Resp (!) 24   Wt 110.2 kg   SpO2 94%   BMI 35.86 kg/m   ED Course as of 05/12/22 2351   Fri May 12, 2022   1625 Labs did not show any evidence of acute myocardial infarction CHF x-ray is reassuring with no pneumonia. [SB]   1625 He does have an elevated glucose however this is not fasting.  He should follow-up with primary care doctor to check her  glucose. [SB]      ED Course User Index  [SB] Harriette Bouillon, MD         PROCEDURES:        IMPRESSION:     51 y.o. female with  1. Asthma exacerbation, non-allergic, mild persistent        DIFFERENTIAL DIAGNOSIS:  Includes but is not limited to  CHF, AMI, PNEUMONIA, PULMONARY EMBOLUS, COPD EXACERBATION,BRONCHITIS, ASTHMA EXACERBATION, PNEUMOTHORAX, PERICARDIAL EFFUSION, PERICARDIAL TAMPONADE      MEDICAL DECISION MAKING:  MDM   Amount and complexity of data contributing to medical decision making for this encounter:      Encounter data: Labs, Comment: no signs of chf or acs ami   Independent interpretation: Xray, EKG        Management Considerations: Prescription drug management                  Low suspicion for PE as his symptoms are consistent with her asthma.  She did feel better after breathing treatments in the emergency department.    I have evaluated all labs and imaging studies in the scope of a single Emergency Department visit and have determined that at this time an acute life threatening illness  does not exist. I have explained all results to the patient and all questions have been answered.  The patient have been given strict return instructions to return to the ER for any worsening or changing symptoms and that they should follow up with a primary care provider. The patient was discharge home in stable condition.    INTERPRETOR USED FOR ALL [facility] INSTRUCTIONS    HEART score:      PLAN:   ED Disposition       ED Disposition   Discharge    Condition   --    Date/Time   Fri May 12, 2022  4:26 PM    Comment   Cheyene Hamric Centuria discharge to home/self care.    Condition at disposition: Stable               Discharge Medication List as of 05/12/2022  4:27  PM        START taking these medications    Details   predniSONE (DELTASONE) 20 MG tablet Take 3 tablets (60 mg) by mouth daily for 5 days, Starting Fri 05/12/2022, Until Wed 05/17/2022, Normal             Note:  This chart was generated by an EMR and may  contain errors, including typographical, or omissions not intended by the user.  This chart was generated by the Epic EMR system/speech recognition and may contain inherent errors or omissions not intended by the user. Grammatical errors, random word insertions, deletions, pronoun errors and incomplete sentences are occasional consequences of this technology due to software limitations. Not all errors are caught or corrected. If there are questions or concerns about the content of this note or information contained within the body of this dictation they should be addressed directly with the author for clarification.     Harriette BouillonBauer, Ambreen Tufte J, MD  05/12/22 2352

## 2023-01-05 IMAGING — MR MUSCULO^COXAS
6 series · 40 of 40 positions shown · non-contrast
Comparison: none

[Series 11: coronal t1_(person_name)_1 · coronal · 5.0mm · 1.33mm/px · 6 of 15 slices shown]
[im 1/15]
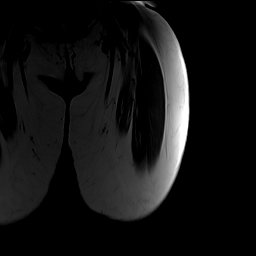
[im 3/15]
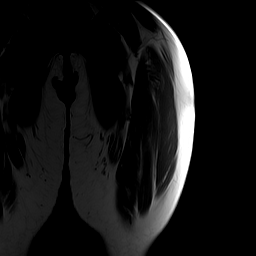
[im 6/15]
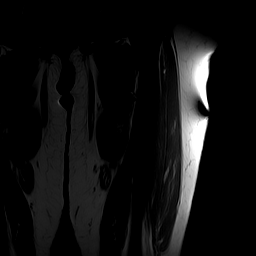
[im 9/15]
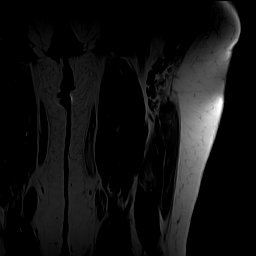
[im 12/15]
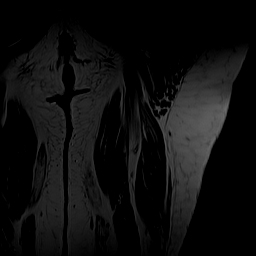
[im 15/15]
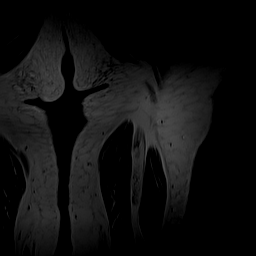

[Series 12: coronal stir_(person_name)_1 · coronal · 6.0mm · 1.41mm/px · 4 of 12 slices shown]
[im 1/12]
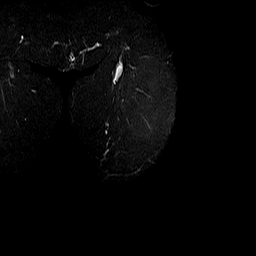
[im 4/12]
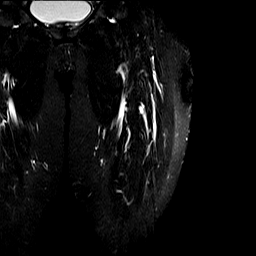
[im 8/12]
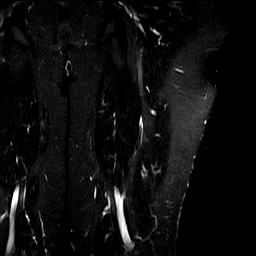
[im 12/12]
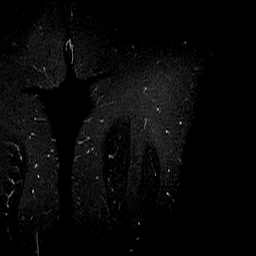

[Series 13: sagital t2_(person_name)_1 · sagittal · 4.5mm · 1.03mm/px · 5 of 15 slices shown]
[im 1/15]
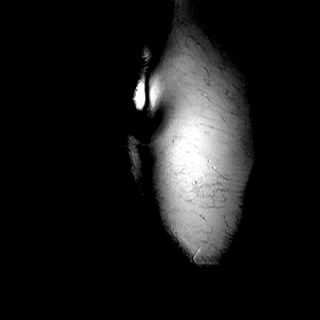
[im 4/15]
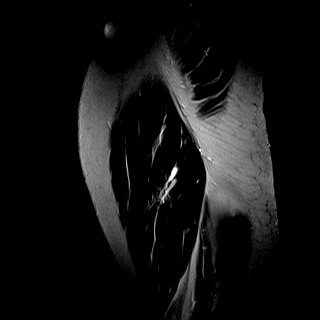
[im 8/15]
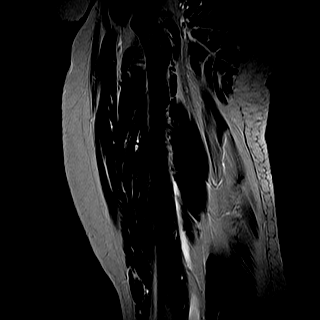
[im 11/15]
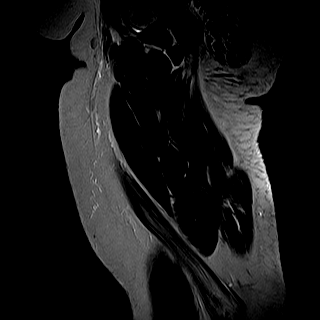
[im 15/15]
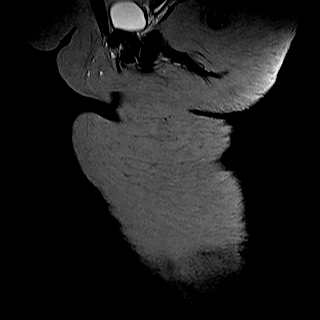

[Series 14: sagital stir_(person_name)_1 · sagittal · 4.0mm · 1.29mm/px · 5 of 14 slices shown]
[im 1/14]
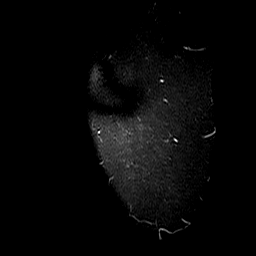
[im 4/14]
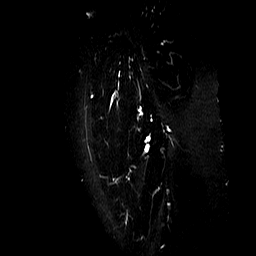
[im 7/14]
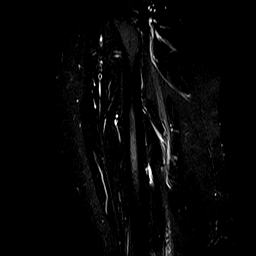
[im 10/14]
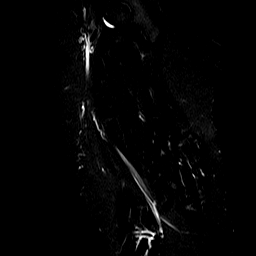
[im 14/14]
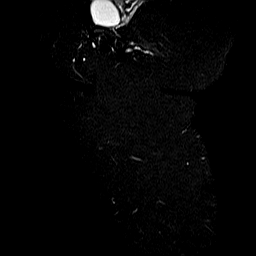

[Series 15: axial stir_(person_name)_1 · axial · 5.5mm · 0.74mm/px · z∈[-106,+182]mm · 10 of 30 slices shown]
[im 1/30]
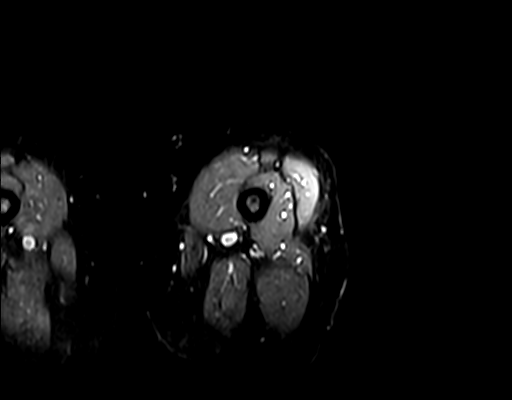
[im 4/30]
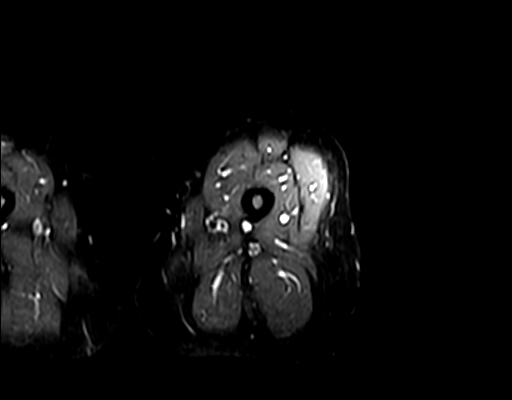
[im 7/30]
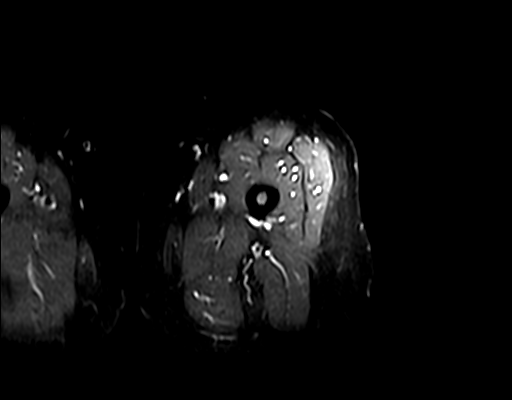
[im 10/30]
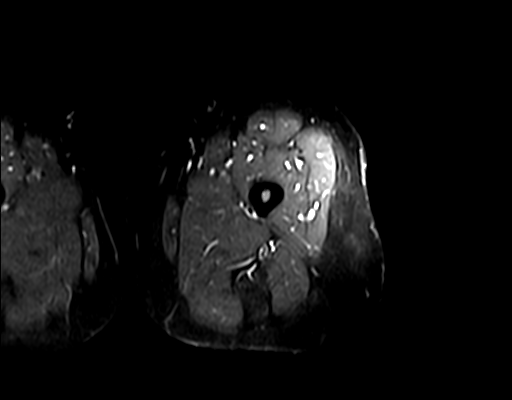
[im 13/30]
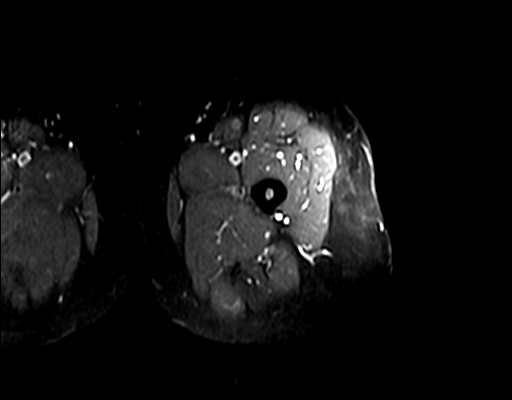
[im 17/30]
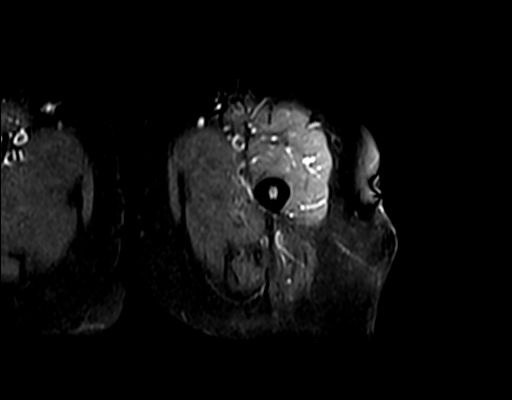
[im 20/30]
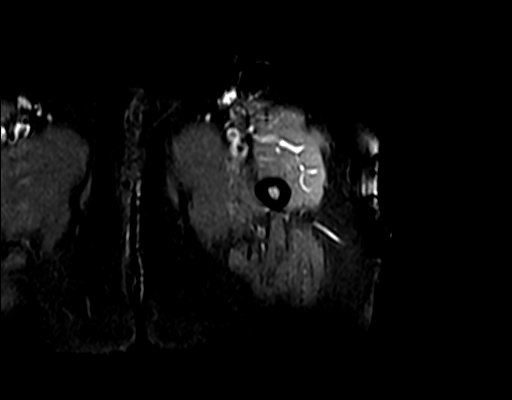
[im 23/30]
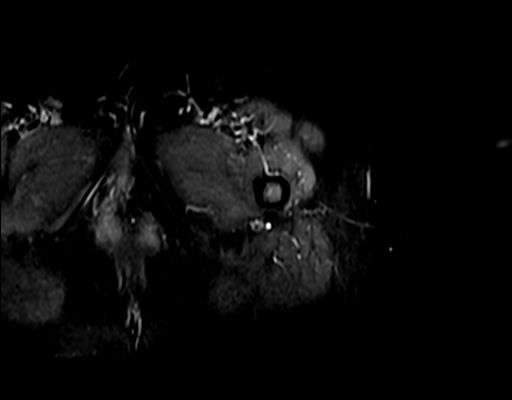
[im 26/30]
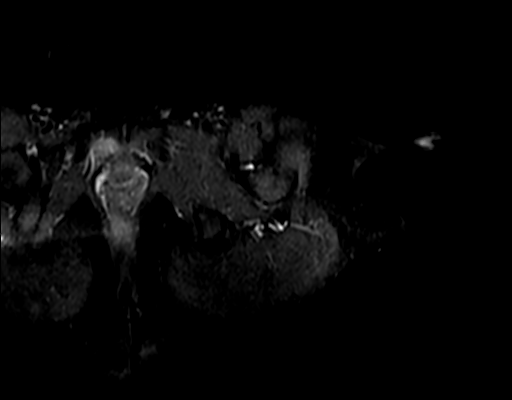
[im 30/30]
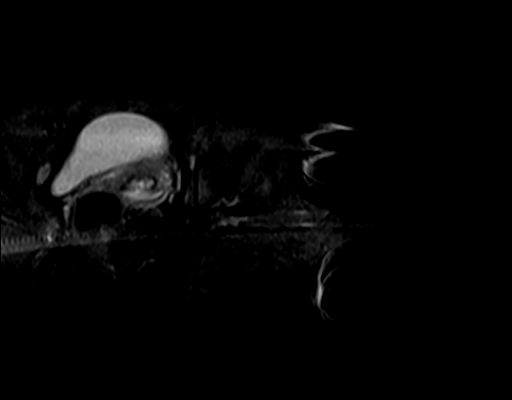

[Series 16: axial t1_(person_name)_1 · axial · 5.5mm · 1.12mm/px · z∈[-106,+182]mm · 10 of 30 slices shown]
[im 1/30]
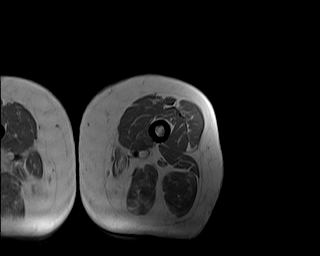
[im 4/30]
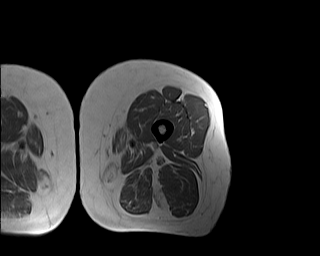
[im 7/30]
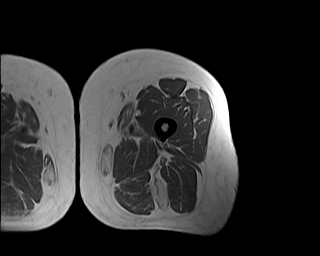
[im 10/30]
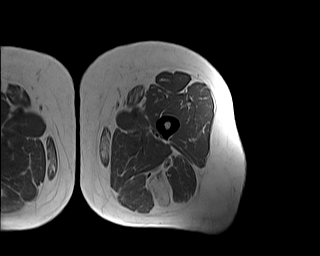
[im 13/30]
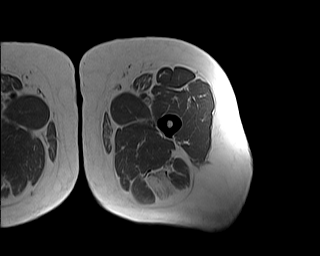
[im 17/30]
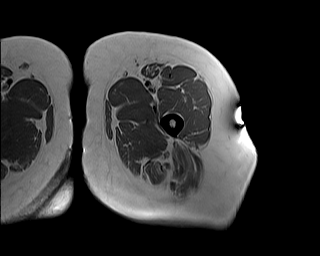
[im 20/30]
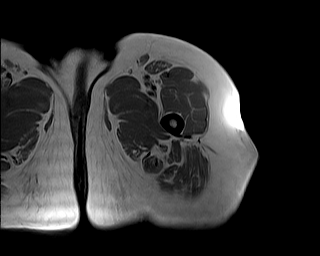
[im 23/30]
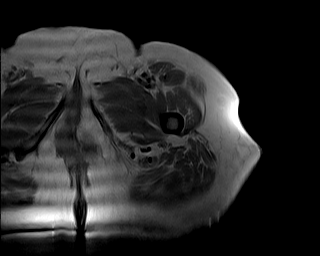
[im 26/30]
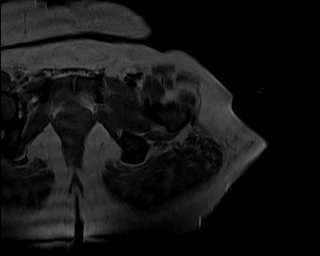
[im 30/30]
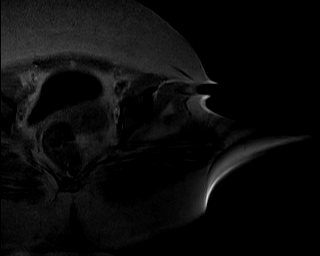

[40 of 40 positions shown; findings below may reference images not displayed]

RESSONÂNCIA MAGNÉTICA DA COXA ESQUERDA
Método:
Ressonância magnética realizada com sequências FSE em T1 e T2. Planos de cortes múltiplos.
Análise:
Tendinopatia na origem dos isquiotibiais, sem roturas. Observa-se atrofia e substituição gordurosa do ventre
muscular do semitendíneo nos terços médio e distal da coxa.
Também se observa substituição gordurosa dos ventres musculares do sartório e do grácil, com hipotrofia
deste último nos terços médio e distal da coxa.
Demais planos miotendíneos preservados.
Estruturas ósseas com morfologia e sinal normais. Não se observam lesões ósseas focais agressivas.
Feixes neurovasculares com trajetos livres nos seus segmentos acessíveis.
Ausência de massas ou coleções.
IMPRESSÃO DIAGNÓSTICA:
Tendinopatia na origem dos isquiotibiais, sem roturas. Observa-se atrofia e substituição gordurosa do ventre
muscular do semitendíneo nos terços médio e distal da coxa.
Também se observa substituição gordurosa dos ventres musculares do sartório e do grácil, com hipotrofia
deste último nos terços médio e distal da coxa.
Demais achados descritos no corpo do laudo.

## 2023-01-05 IMAGING — MR MUSCULO^JOELHO
7 series · 38 of 40 positions shown · non-contrast
Comparison: none

[Series 11: T2 · oblique · 2.5mm · 0.35mm/px · 2 of 9 slices shown]
[im 1/9]
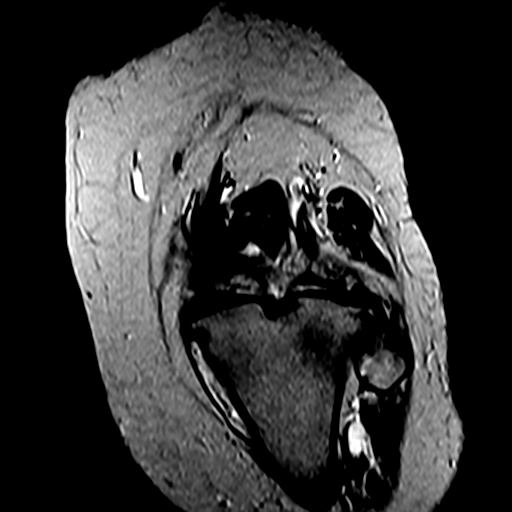
[im 9/9]
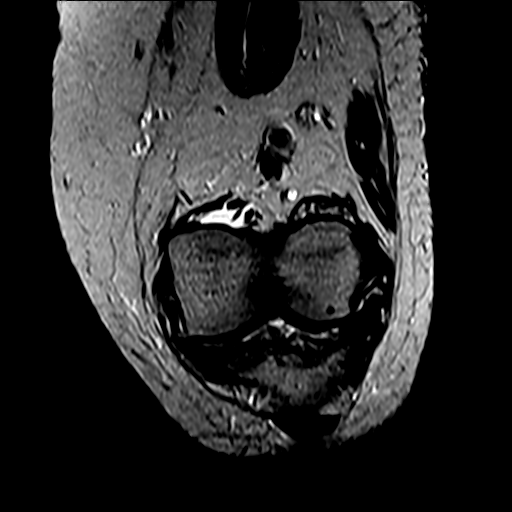

[Series 12: sagital stir_(person_name)_1 · sagittal · 4.0mm · 0.70mm/px · 5 of 16 slices shown]
[im 1/16]
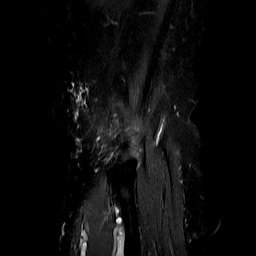
[im 4/16]
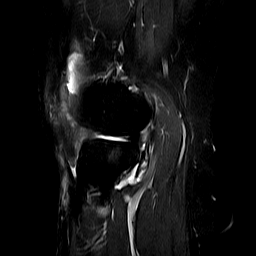
[im 8/16]
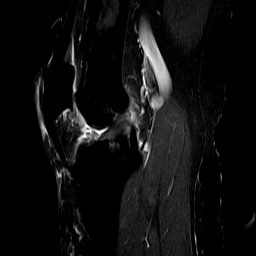
[im 12/16]
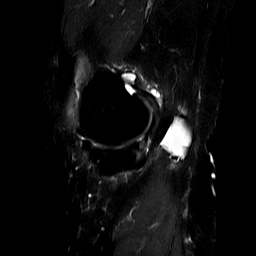
[im 16/16]
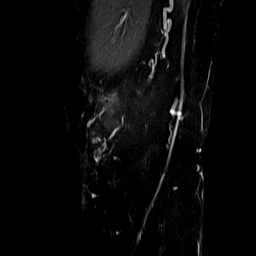

[Series 13: sagital dp_(person_name)_1 · sagittal · 4.0mm · 0.56mm/px · 6 of 16 slices shown]
[im 1/16]
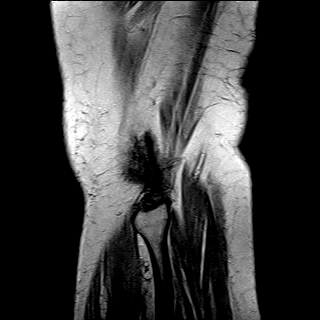
[im 4/16]
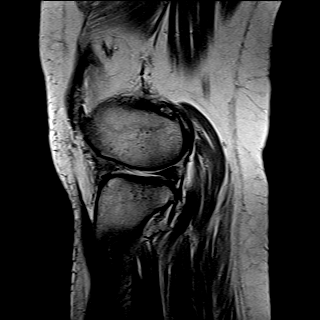
[im 7/16]
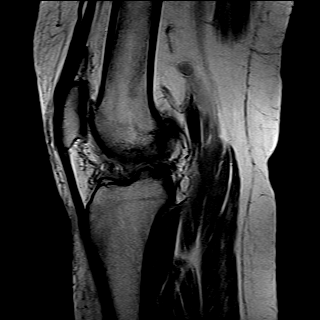
[im 10/16]
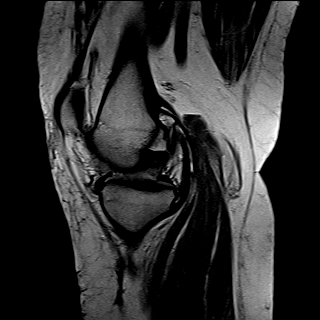
[im 13/16]
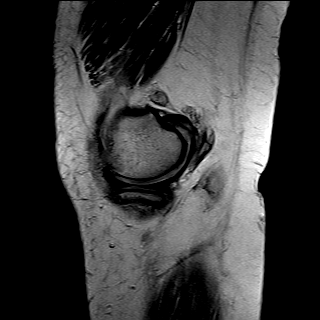
[im 16/16]
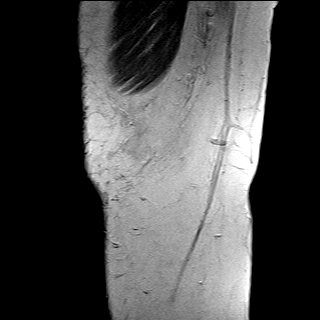

[Series 14: axial stir_(person_name)_1 · axial · 4.0mm · 0.70mm/px · z∈[-71,+39]mm · 7 of 20 slices shown]
[im 1/20]
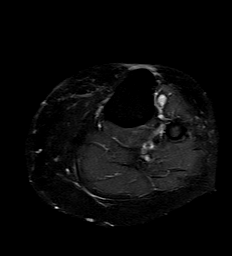
[im 4/20]
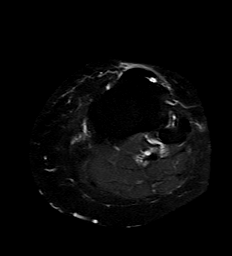
[im 7/20]
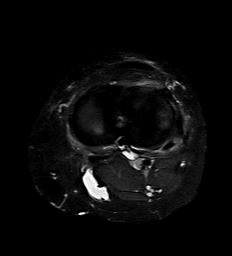
[im 10/20]
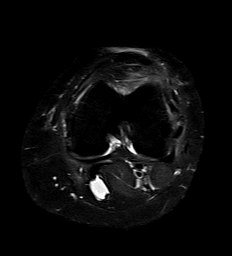
[im 13/20]
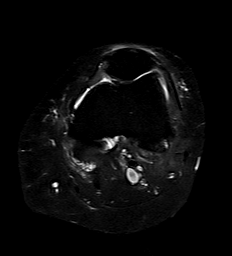
[im 16/20]
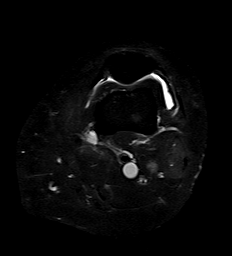
[im 20/20]
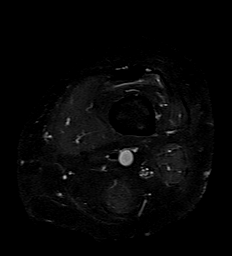

[Series 15: coronal stir_(person_name)_1 · coronal · 4.0mm · 0.70mm/px · 6 of 16 slices shown]
[im 1/16]
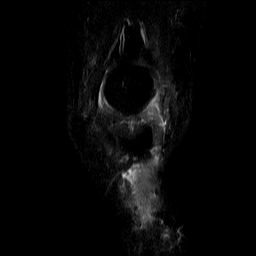
[im 4/16]
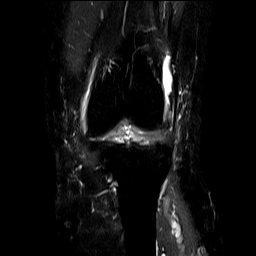
[im 7/16]
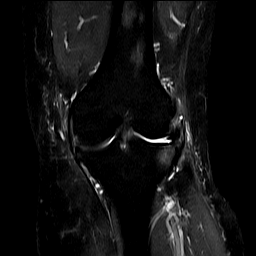
[im 10/16]
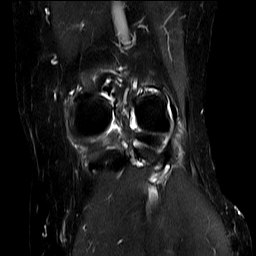
[im 13/16]
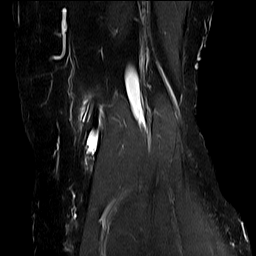
[im 16/16]
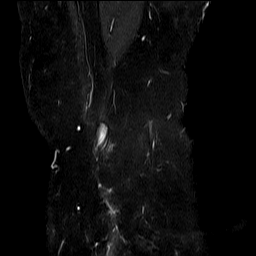

[Series 16: coronal t1_(person_name)_1 · coronal · 4.0mm · 0.56mm/px · 6 of 16 slices shown]
[im 1/16]
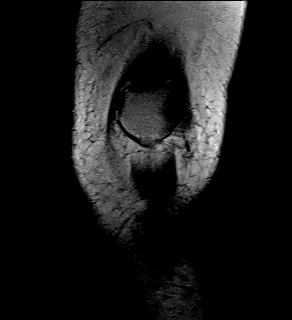
[im 4/16]
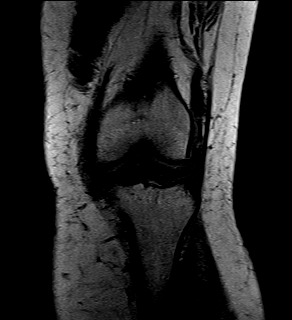
[im 7/16]
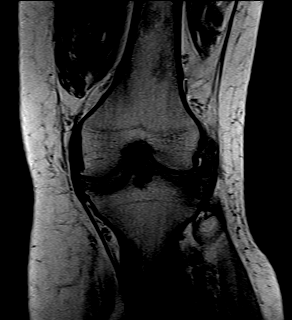
[im 10/16]
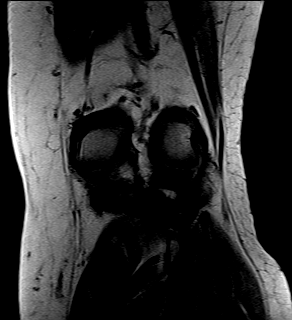
[im 13/16]
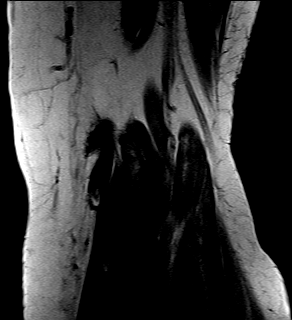
[im 16/16]
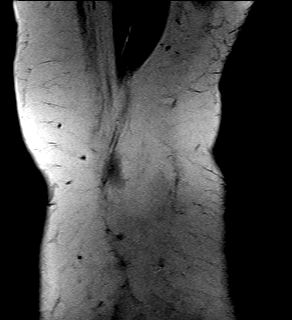

[Series 17: axial patela_(person_name)_1 · axial · 3.0mm · 0.31mm/px · z∈[-30,+15]mm · 6 of 22 slices shown]
[im 1/22]
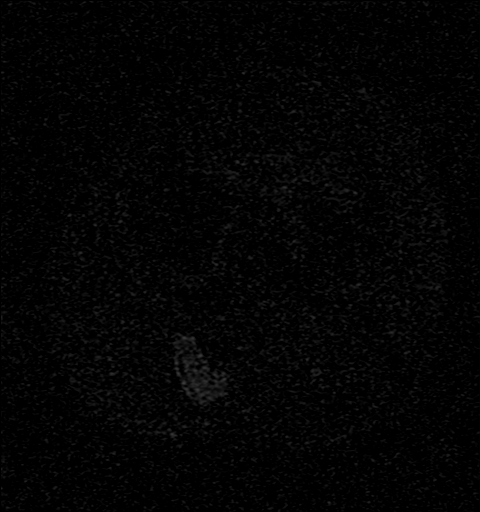
[im 4/22]
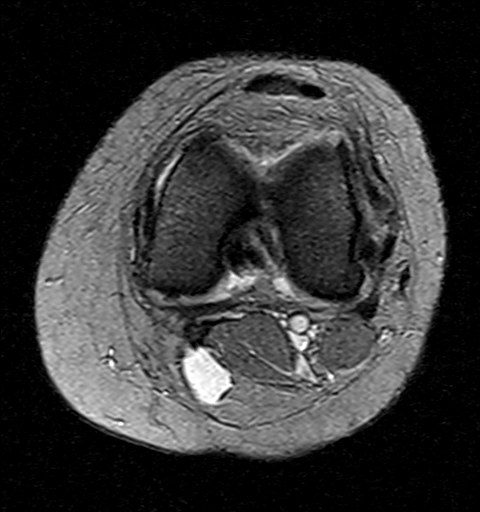
[im 7/22]
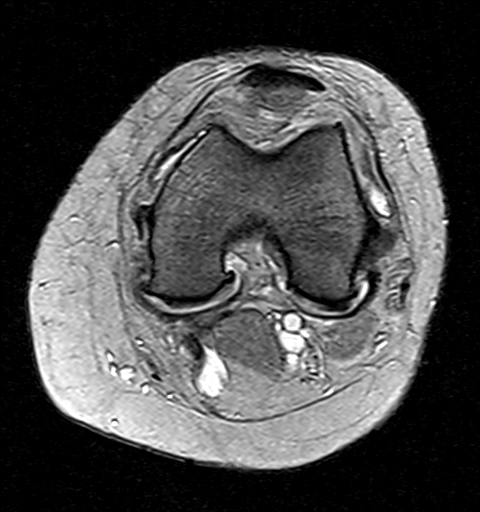
[im 10/22]
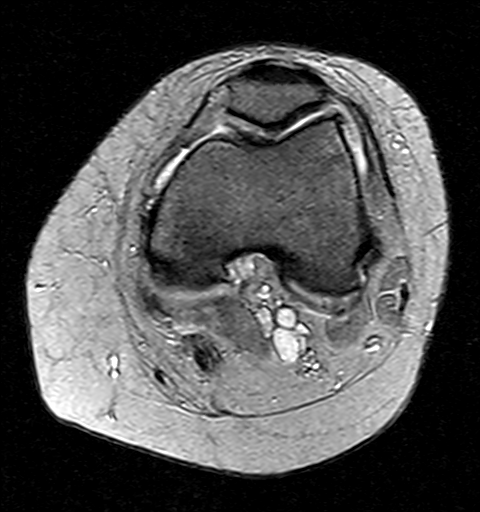
[im 13/22]
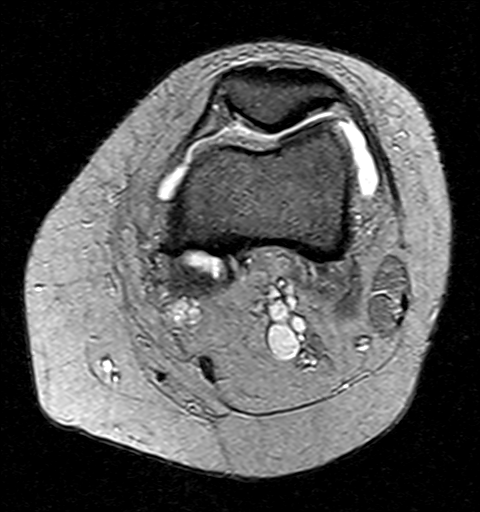
[im 16/22]
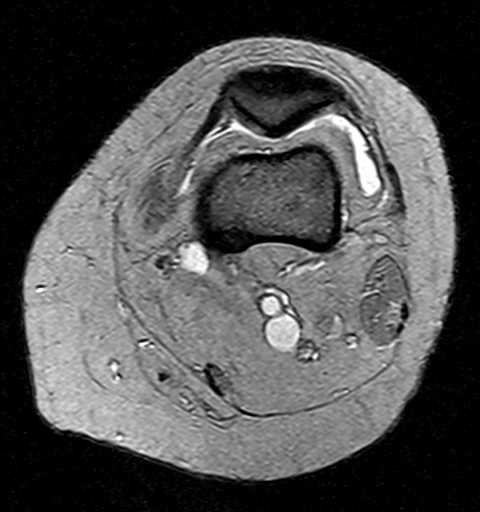

[38 of 40 positions shown; findings below may reference images not displayed]

RESSONÂNCIA MAGNÉTICA DO JOELHO ESQUERDO
Método:
Ressonância magnética realizada com sequências FSE em T1 e T2. Planos de cortes múltiplos. 
Análise:
Alteração degenerativa difusa do menisco lateral, com importante redução volumétrica do seu corno anterior,
com maceração dos seus remanescentes, associada a amputação e irregularidade difusa da margem livre,
bem como um afilamento da sua raiz anterior, observando-se extrusão do corpo meniscal em relação à
interlinha articular e sinais de perimeniscite.
Alteração degenerativa difusa do menisco medial, predominando no corpo e corno posterior, sem roturas
meniscais definidas.
Alterações degenerativas dos compartimentos femorotibiais caracterizadas por reações osteofitárias
marginais, com afilamento e irregularidade difusa dos revestimentos condrais, predominando nas áreas de
carga e, notadamente, no compartimento lateral, no qual se observam áreas de exposição da placa óssea
subcondral, com esclerose, edema e cistos subcondrais adjacentes, bem como remodelamento das superfícies
articulares apostas. 
Alterações degenerativas incipientes do compartimento patelofemoral caracterizadas por reações osteofitárias
marginais, com afilamento e irregularidade dos revestimentos condrais, predominando nos terços superior e
médio da patela e nos terços superior e médio da tróclea femoral, com algumas fissuras profundas
adjacentes, sem edema ósseo subcondral definido.
Discreta entesopatia insercional do quadríceps e na origem do patelar, sem roturas. 
Leve edema da porção superolateral da gordura infrapatelar, que pode estar relacionado a discreto atrito
patelofemoral.
Alteração degenerativa mucinosa do ligamento cruzado anterior, sem transfixações atuais.
Ligamentos cruzado posterior e colaterais íntegros.
Hipertrofia das espinhas tibiais, com pequenos cistos subcorticais adjacentes.
Demais estruturas ósseas com morfologia e sinal normais.
Pequeno derrame articular com sinais de sinovite, provavelmente reacional.
Pequeno cisto poplíteo (Baker), sem sinais de extravasamento líquido. 
Demais planos musculares e tendíneos preservados.
Feixes neurovasculares com trajetos livres.
Leve edema da tela subcutânea anterior do joelho, sem coleções.
IMPRESSÃO DIAGNÓSTICA:
Alteração degenerativa difusa dos meniscos, notadamente no lateral, pormenorizada acima, com extrusão do
corpo meniscal em relação à interlinha articular e sinais de perimeniscite.
Alteração degenerativa dos compartimentos femorotibiais, predominando no compartimento lateral.
Alteração degenerativa incipiente patelofemoral.
Pequeno derrame articular com sinais de sinovite.
Discreta entesopatia insercional do quadríceps e na origem do patelar, sem roturas. 
Demais achados descritos no corpo do laudo.

## 2023-01-05 IMAGING — MR OLD^PERNA UNI SO UMA NA [PERSON_NAME]
6 series · 40 of 40 positions shown · non-contrast
Comparison: none

[Series 9: cor_t1_se_(person_name)_1 · coronal · 5.0mm · 1.22mm/px · 6 of 16 slices shown]
[im 1/16]
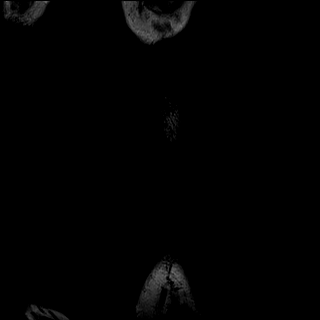
[im 4/16]
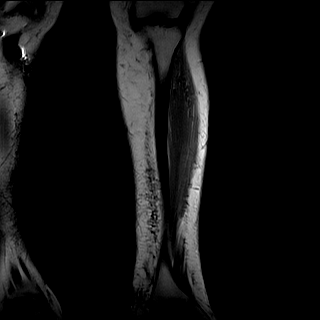
[im 7/16]
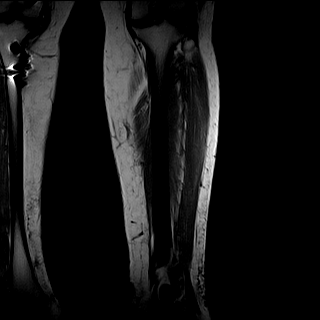
[im 10/16]
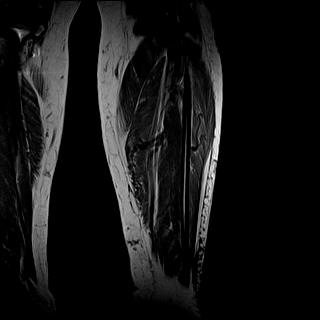
[im 13/16]
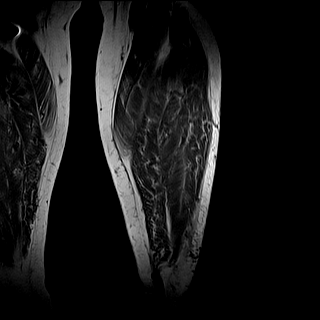
[im 16/16]
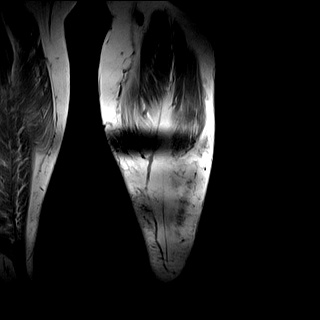

[Series 10: cor_stir_(person_name)_1 · coronal · 5.0mm · 1.56mm/px · 6 of 16 slices shown]
[im 1/16]
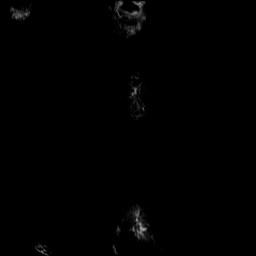
[im 4/16]
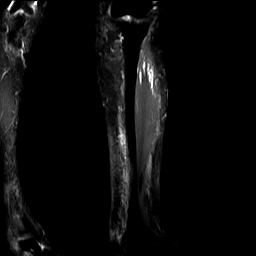
[im 7/16]
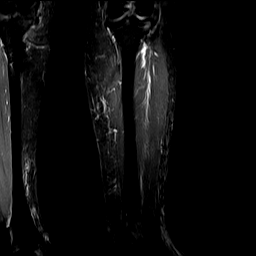
[im 10/16]
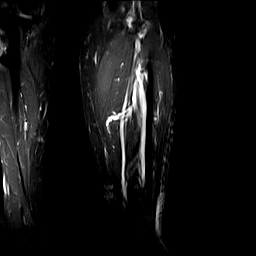
[im 13/16]
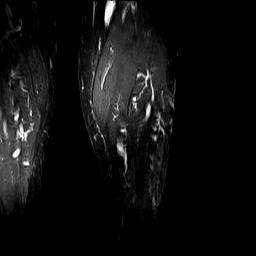
[im 16/16]
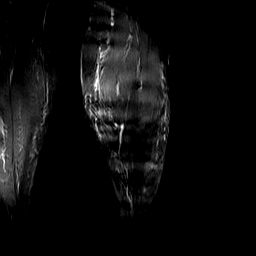

[Series 11: ax_stir_(person_name)_1 · axial · 6.0mm · 1.07mm/px · z∈[-187,+155]mm · 8 of 20 slices shown]
[im 1/20]
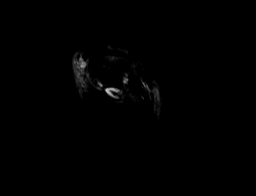
[im 3/20]
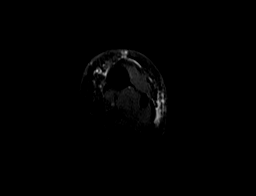
[im 6/20]
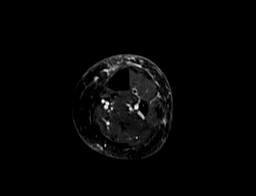
[im 9/20]
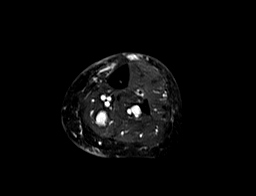
[im 11/20]
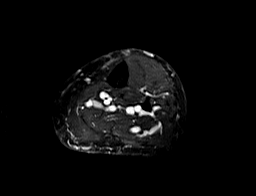
[im 14/20]
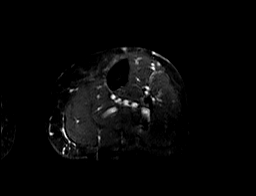
[im 17/20]
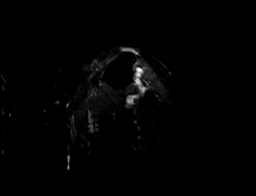
[im 20/20]
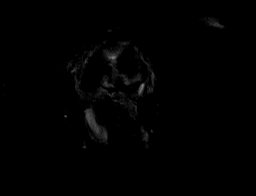

[Series 12: ax_t1_se_(person_name)_1 · axial · 6.0mm · 0.86mm/px · z∈[-185,+156]mm · 8 of 20 slices shown]
[im 1/20]
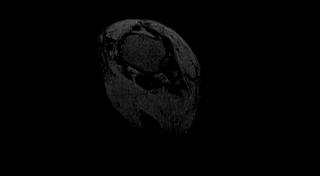
[im 3/20]
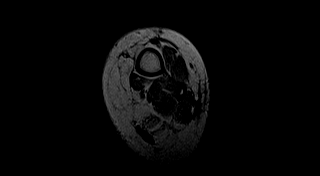
[im 6/20]
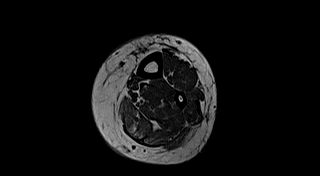
[im 9/20]
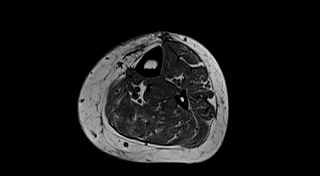
[im 11/20]
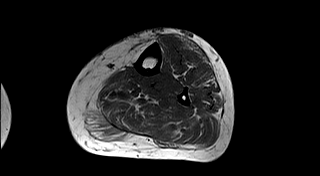
[im 14/20]
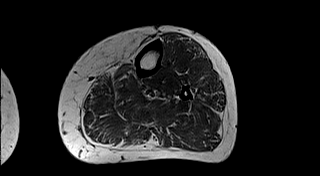
[im 17/20]
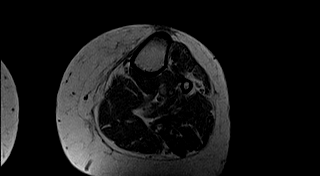
[im 20/20]
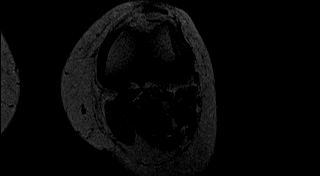

[Series 13: sag_stir_(person_name)_1 · sagittal · 3.5mm · 1.52mm/px · 6 of 16 slices shown]
[im 1/16]
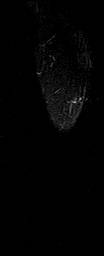
[im 4/16]
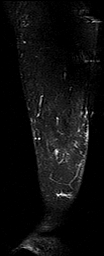
[im 7/16]
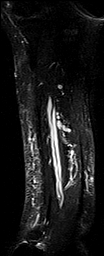
[im 10/16]
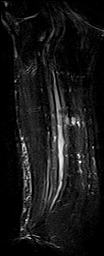
[im 13/16]
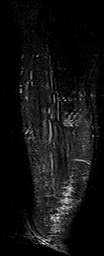
[im 16/16]
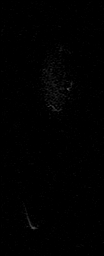

[Series 14: sagital t2_(person_name)_1 · sagittal · 4.0mm · 1.03mm/px · 6 of 15 slices shown]
[im 1/15]
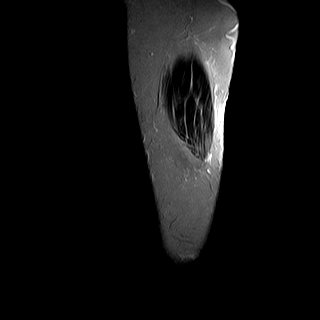
[im 3/15]
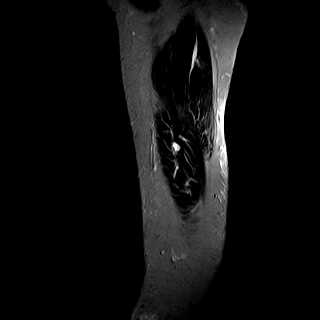
[im 6/15]
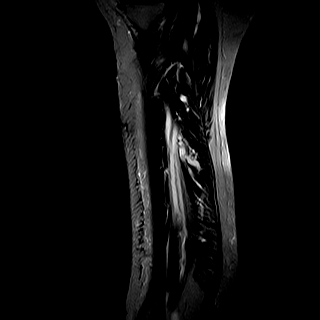
[im 9/15]
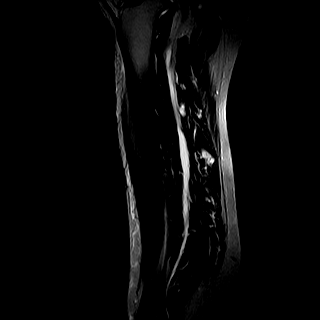
[im 12/15]
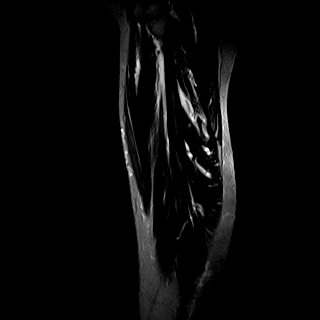
[im 15/15]
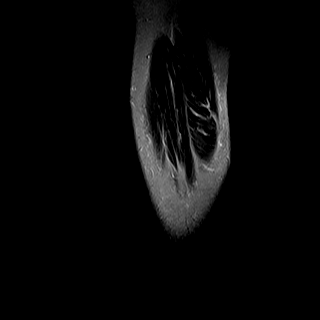

[40 of 40 positions shown; findings below may reference images not displayed]

RESSONÂNCIA MAGNÉTICA DA PERNA ESQUERDA
Método:
Ressonância magnética realizada com sequências FSE em T1 e T2. Planos de cortes múltiplos.
Análise:
Edema da tela subcutânea da perna, mais evidente na face anterior, sem coleções. 
Estruturas ósseas com morfologia e sinal normais. Não se observam lesões ósseas focais agressivas.
Planos musculares e tendíneos preservados. 
Feixes neurovasculares com trajetos livres nos seus segmentos acessíveis.
Ausência de massas ou coleções.
IMPRESSÃO DIAGNÓSTICA:
Edema da tela subcutânea da perna, mais evidente na face anterior, sem coleções. 
Demais achados descritos no corpo do laudo.

## 2023-01-09 IMAGING — MR MUSCULO^TORNOZELO
7 series · 40 of 40 positions shown · non-contrast
Comparison: none

[Series 10: coronal stir_(person_name)_1 · coronal · 4.0mm · 0.70mm/px · 4 of 15 slices shown]
[im 1/15]
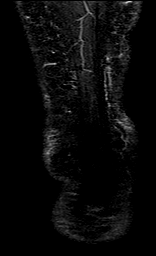
[im 5/15]
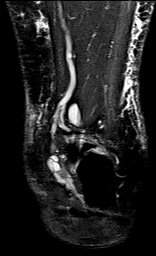
[im 10/15]
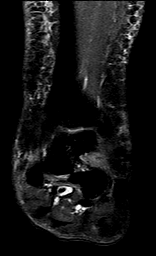
[im 15/15]
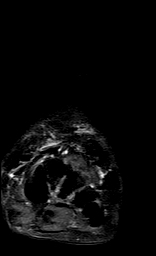

[Series 11: sagital stir_(person_name)_1 · sagittal · 4.0mm · 0.70mm/px · 5 of 16 slices shown]
[im 1/16]
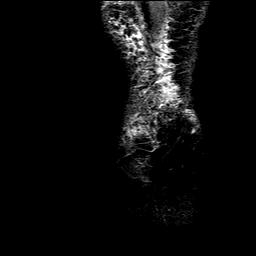
[im 4/16]
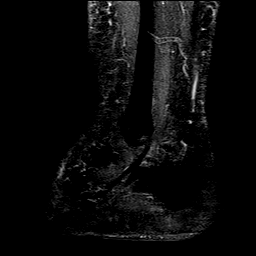
[im 8/16]
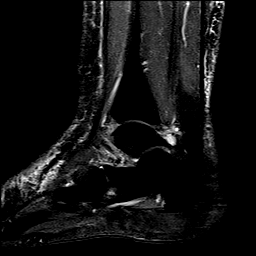
[im 12/16]
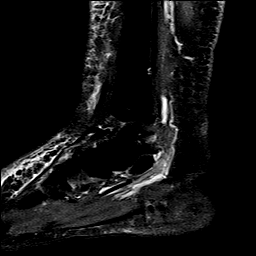
[im 16/16]
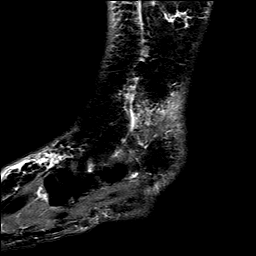

[Series 12: sagital t1_(person_name)_1 · sagittal · 4.0mm · 0.56mm/px · 5 of 16 slices shown]
[im 1/16]
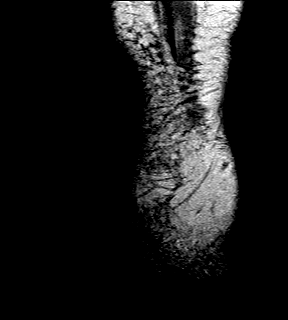
[im 4/16]
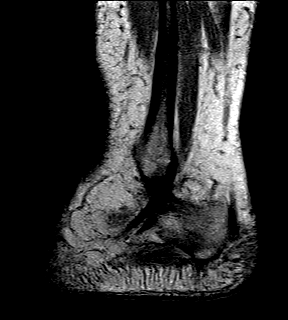
[im 8/16]
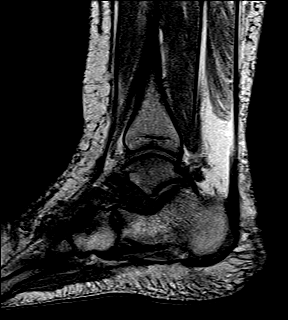
[im 12/16]
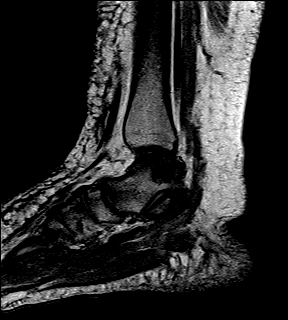
[im 16/16]
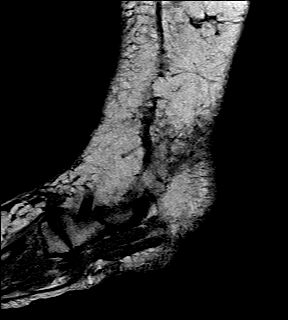

[Series 13: sagital t2_(person_name)_1 · sagittal · 4.0mm · 0.35mm/px · 5 of 16 slices shown]
[im 1/16]
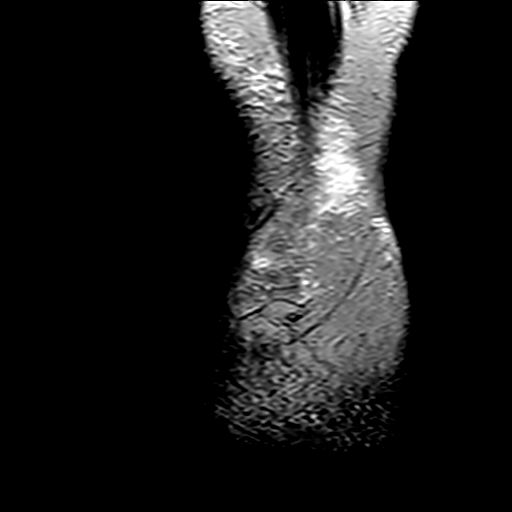
[im 4/16]
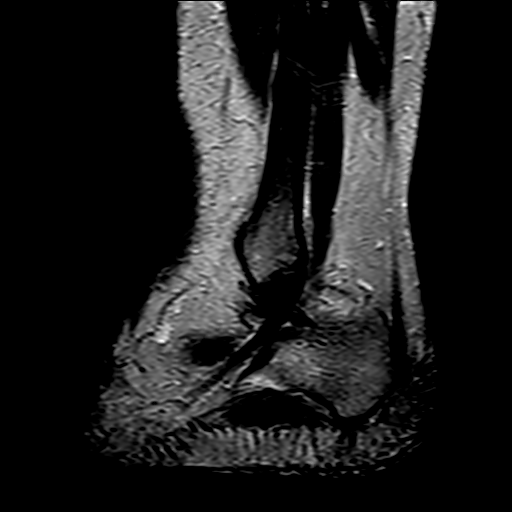
[im 8/16]
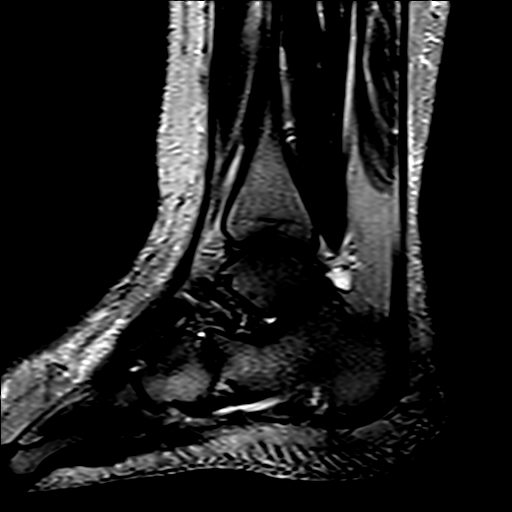
[im 12/16]
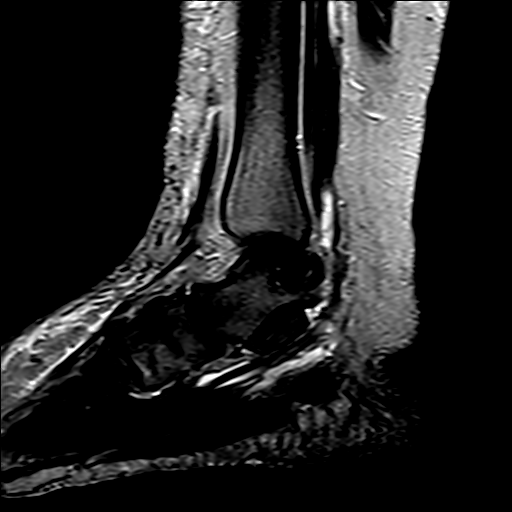
[im 16/16]
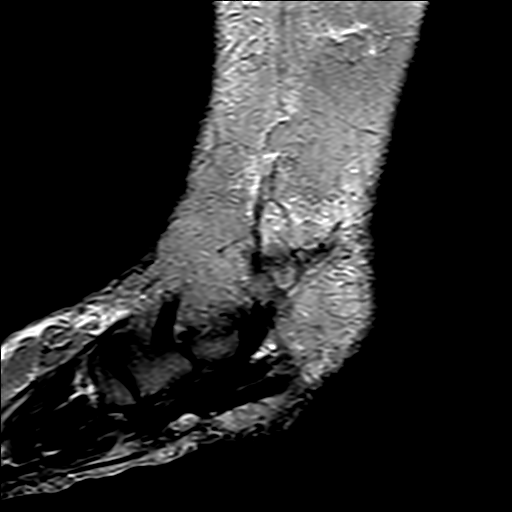

[Series 14: coronal t1_(person_name)_1 · coronal · 4.0mm · 0.56mm/px · 7 of 20 slices shown]
[im 1/20]
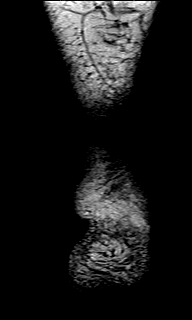
[im 4/20]
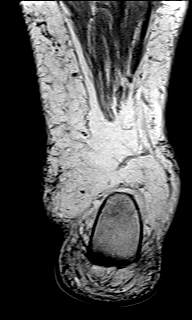
[im 7/20]
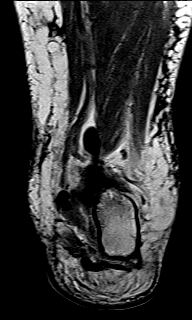
[im 10/20]
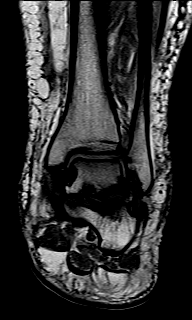
[im 13/20]
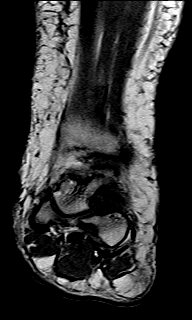
[im 16/20]
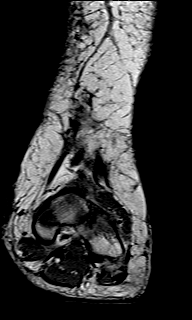
[im 20/20]
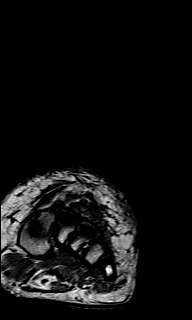

[Series 15: axial stir_(person_name)_1 · axial · 3.5mm · 0.31mm/px · z∈[-100,-1]mm · 7 of 20 slices shown]
[im 1/20]
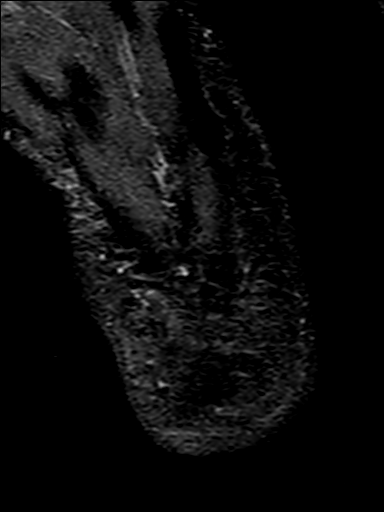
[im 4/20]
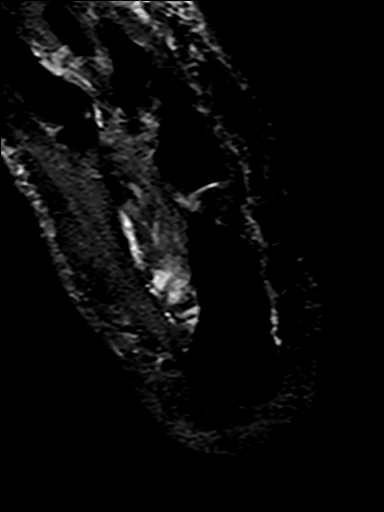
[im 7/20]
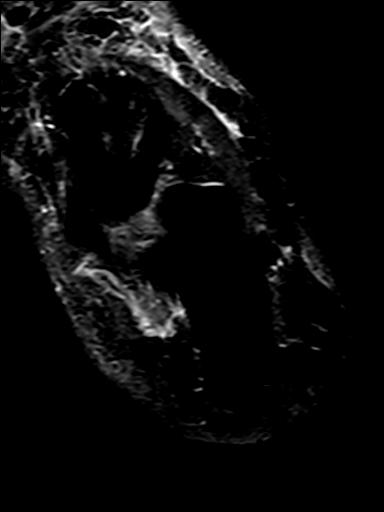
[im 10/20]
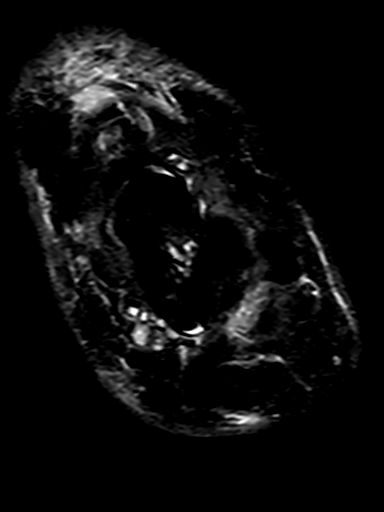
[im 13/20]
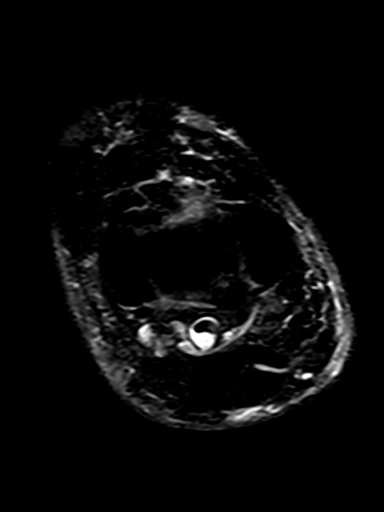
[im 16/20]
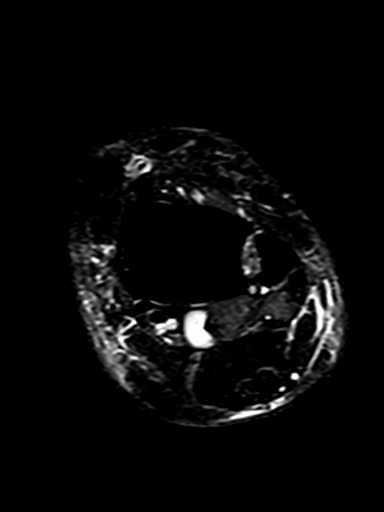
[im 20/20]
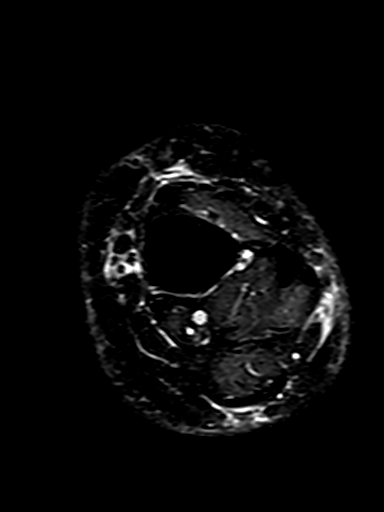

[Series 16: axial t1_(person_name)_1 · axial · 3.5mm · 0.50mm/px · z∈[-100,-1]mm · 7 of 20 slices shown]
[im 1/20]
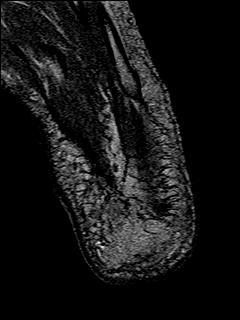
[im 4/20]
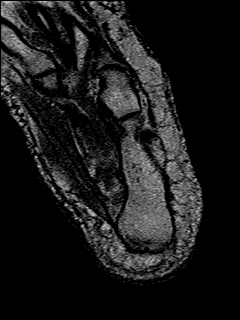
[im 7/20]
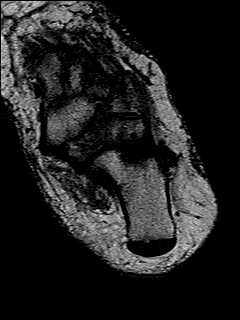
[im 10/20]
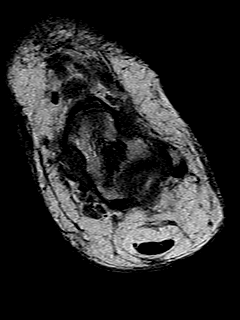
[im 13/20]
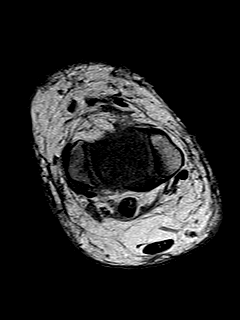
[im 16/20]
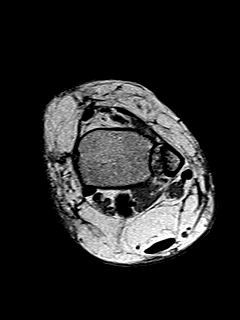
[im 20/20]
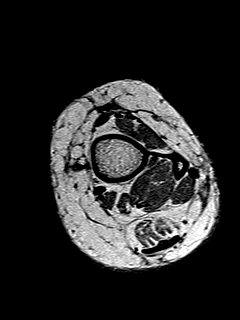

[40 of 40 positions shown; findings below may reference images not displayed]

Solicitante: TRUCO ESQUERRE B AUAD
Técnica:
Foram realizadas sequências multiplanares com ponderações em T1 e T2/DP, com e sem supressão do sinal
da gordura.
Relatório:
Avaliação limitada por artefatos de susceptibilidade magnética e de movimentos.
RESSONÂNCIA MAGNÉTICA DO TORNOZELO ESQUERDO
Alterações degenerativas e edema ósseo subcondral talo-crurais e subtalares.
Pequeno derrame articular talo-crural e subtalar posterior.
Distensão líquida da bainha sinovial do flexor longo do hálux.
Tendões fibulares longo e curto com trajetos, morfologias e intensidades de sinal normais.
Tendões flexores longos dos dedos e do hálux e tendão tibial posterior com trajetos, espessuras e
intensidades de sinal normais.
Tendões extensores longos dos dedos e do hálux e tendão do tibial anterior com trajetos, espessuras e
intensidades de sinal normais.
Sindesmose tíbio-fibular sem alterações detectáveis.
Ligamentos talo-fibulares e calcâneo-fibular sem alterações detectáveis.
Complexo ligamentar medial íntegro.
Tendão calcâneo com trajeto, espessura e intensidade de sinal normais.
Solicitante: TRUCO ESQUERRE B AUAD
Fáscia plantar com morfologia e intensidades de sinal normais.
Hipotrofia e lipossubstituição parcial de ventres musculares.
Espessamento e alteração de sinal das partes moles superficiais.
Impressão:
Osteoartrose e derrames articulares no tornozelo.
Tenossinovite do flexor longo do hálux.
Hipotrofia de ventres musculares.
Edema de partes moles periarticulares.

## 2023-01-09 IMAGING — MR MUSCULO^PE
11 series · 40 of 40 positions shown · non-contrast
Comparison: none

[Series 16: coronal stir_(person_name)_1 · oblique · 3.0mm · 0.35mm/px · 2 of 16 slices shown]
[im 1/16]
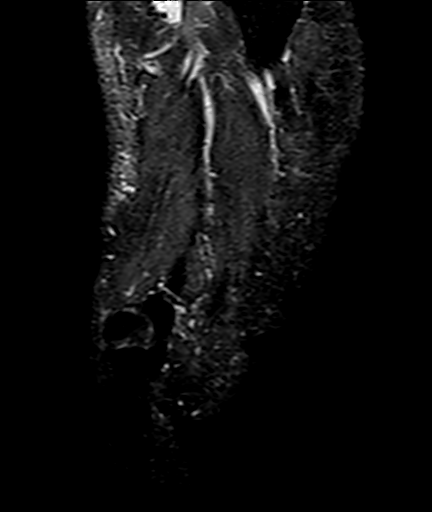
[im 16/16]
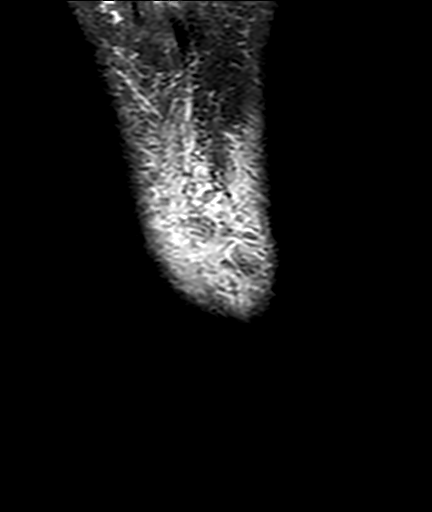

[Series 17: coronal t1_(person_name)_1 · oblique · 3.0mm · 0.56mm/px · 3 of 16 slices shown]
[im 1/16]
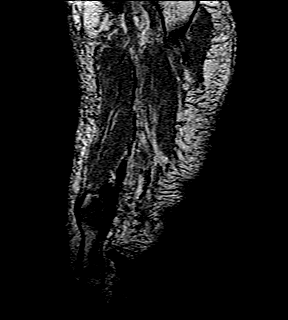
[im 8/16]
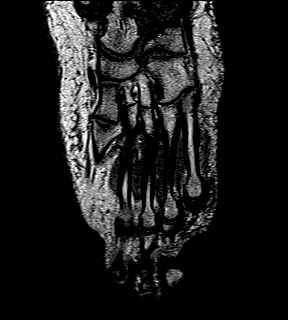
[im 16/16]
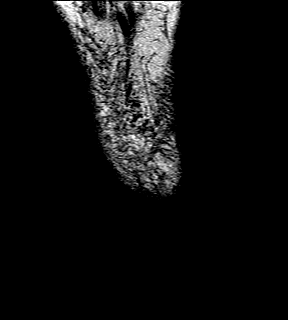

[Series 18: DIXON · oblique · 3.0mm · 0.70mm/px · 3 of 16 slices shown (1 of 4)]
[im 1/16]
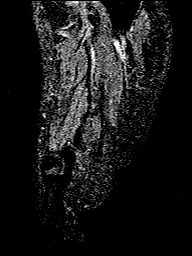
[im 8/16]
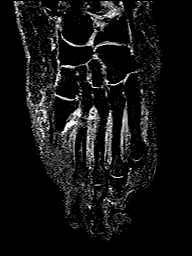
[im 16/16]
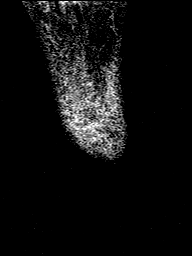

[Series 19: DIXON · oblique · 3.0mm · 0.70mm/px · 3 of 16 slices shown (2 of 4)]
[im 1/16]
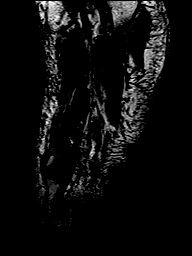
[im 8/16]
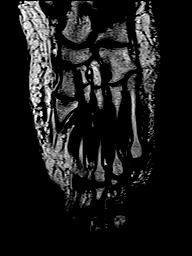
[im 16/16]
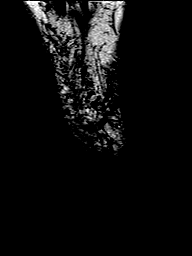

[Series 20: sagital t2_(person_name)_2 · oblique · 4.0mm · 0.35mm/px · 3 of 16 slices shown]
[im 1/16]
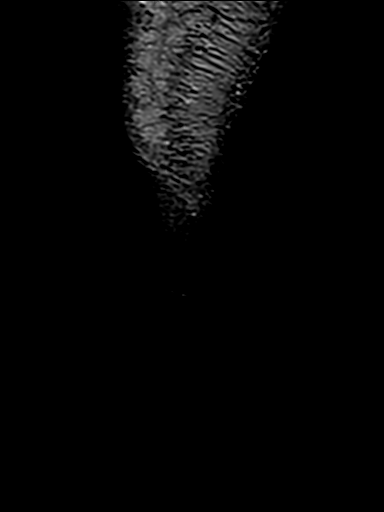
[im 8/16]
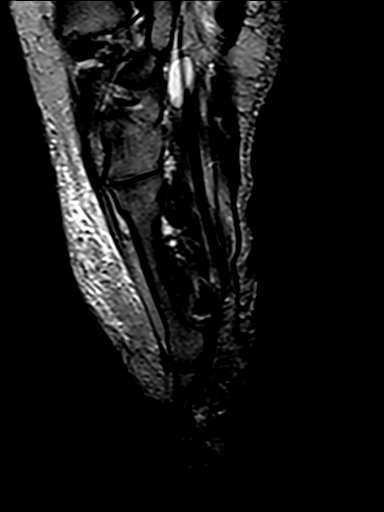
[im 16/16]
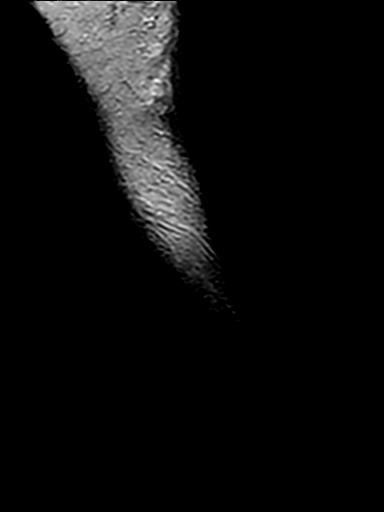

[Series 21: axial stir_(person_name)_2 · axial · 3.0mm · 0.27mm/px · z∈[-61,+58]mm · 5 of 25 slices shown]
[im 1/25]
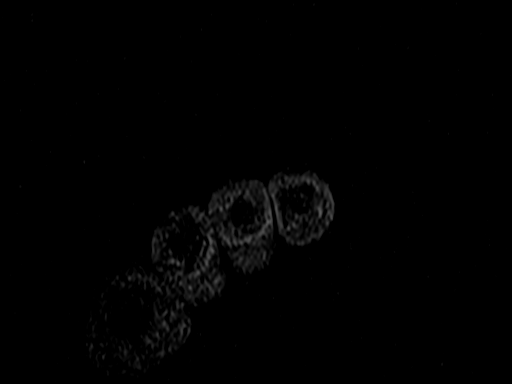
[im 7/25]
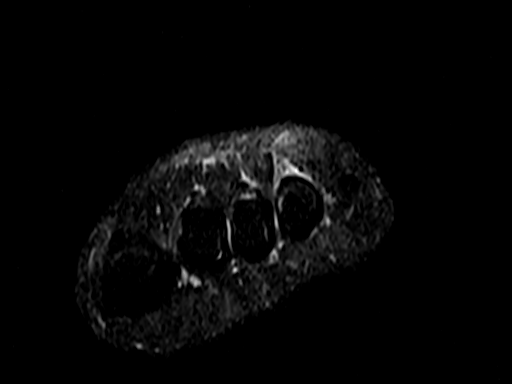
[im 13/25]
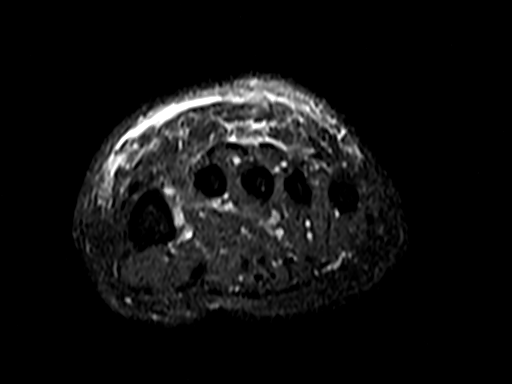
[im 19/25]
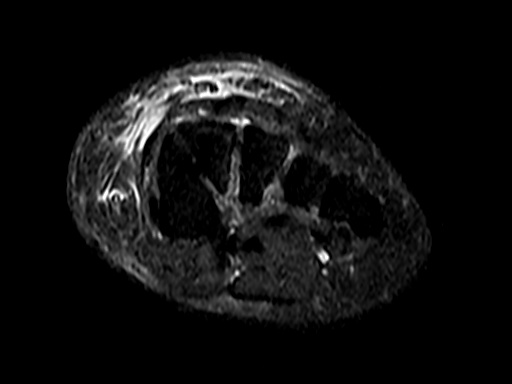
[im 25/25]
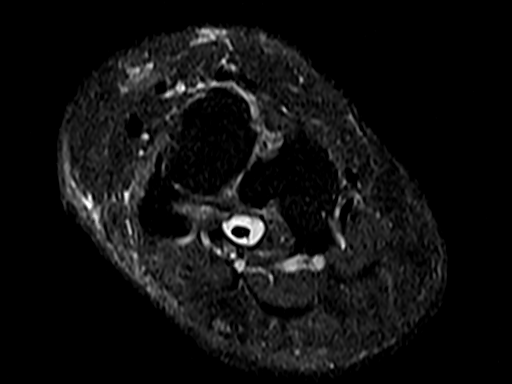

[Series 22: axial t1_(person_name)_2 · axial · 3.0mm · 0.55mm/px · z∈[-61,+58]mm · 5 of 25 slices shown]
[im 1/25]
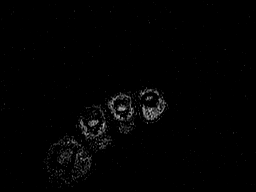
[im 7/25]
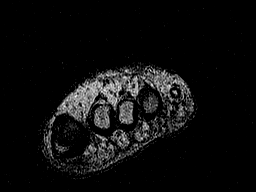
[im 13/25]
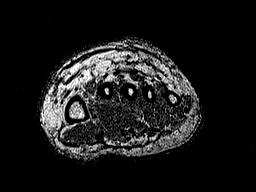
[im 19/25]
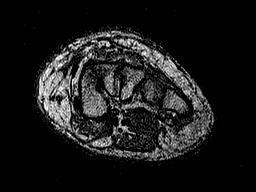
[im 25/25]
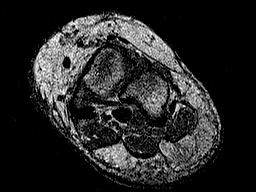

[Series 23: DIXON · axial · 3.0mm · 0.59mm/px · z∈[-59,+61]mm · 5 of 25 slices shown (3 of 4)]
[im 1/25]
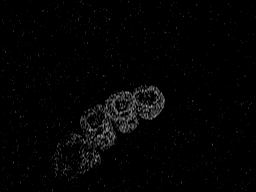
[im 7/25]
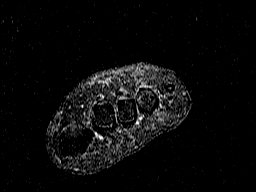
[im 13/25]
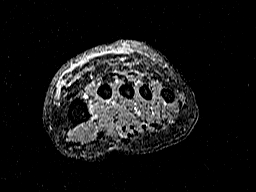
[im 19/25]
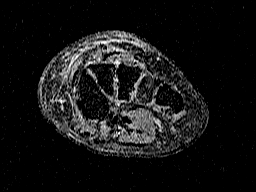
[im 25/25]
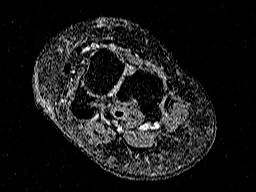

[Series 24: DIXON · axial · 3.0mm · 0.59mm/px · z∈[-59,+61]mm · 5 of 25 slices shown (4 of 4)]
[im 1/25]
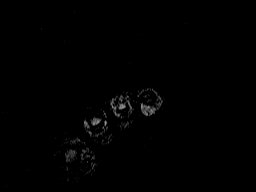
[im 7/25]
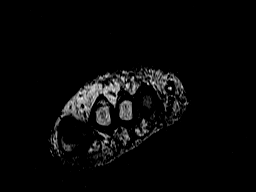
[im 13/25]
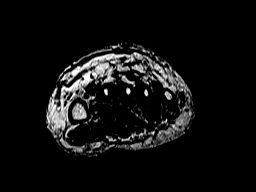
[im 19/25]
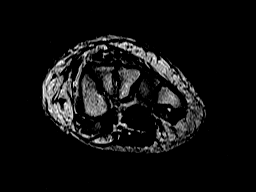
[im 25/25]
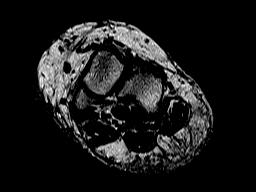

[Series 25: T1 · oblique · 3.0mm · 0.70mm/px · 3 of 16 slices shown (1 of 2)]
[im 1/16]
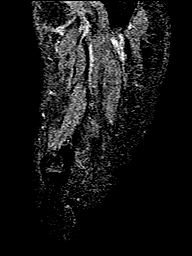
[im 8/16]
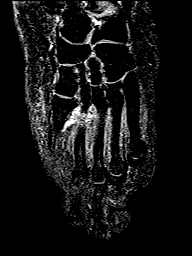
[im 16/16]
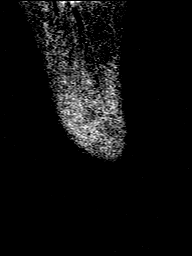

[Series 26: T1 · oblique · 3.0mm · 0.70mm/px · 3 of 16 slices shown (2 of 2)]
[im 1/16]
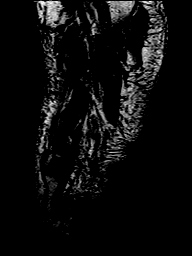
[im 8/16]
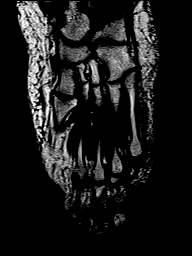
[im 16/16]
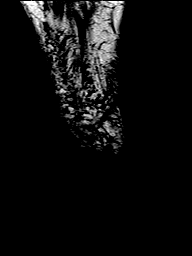

[40 of 40 positions shown; findings below may reference images not displayed]

Solicitante: DESIDERI YABRE B ACKY
Técnica:
Exame realizado com sequências ponderadas em T1 e T2 sem e com supressão de gordura, antes e após
infusão endovenosa de solução de contraste
Relatório:
Estruturas ósseas de morfologias e intensidades de sinal preservadas.
Tendões flexores e extensores com espessuras, trajetos e intensidades de sinal normais.
RESSONÂNCIA MAGNÉTICA DO PÉ ESQUERDO
Ventres musculares eutróficos.
No espaços intermetatarsais 2 e 3, há esboços nodulares, com hipossinal T1, leve hipersinal T2, realce pelo
gadolínio, 1.2 x 0.6 cm e 1.3 x 0.8 cm respectivamente.
Espessamento e alteração de sinal das partes moles superficiais, principalmente no dorso do pé.
Impressão:
Acentuado edema de partes moles.
Esboços nodulares intermetatarsais (neuromas?).
